# Patient Record
Sex: Male | Born: 1935 | Race: White | Hispanic: No | Marital: Married | Smoking: Former smoker
Health system: Southern US, Community
[De-identification: ages and names within clinical notes are randomized; demographics above are authoritative.]

## PROBLEM LIST (undated history)

## (undated) DIAGNOSIS — C801 Malignant (primary) neoplasm, unspecified: Secondary | ICD-10-CM

## (undated) DIAGNOSIS — I10 Essential (primary) hypertension: Secondary | ICD-10-CM

## (undated) DIAGNOSIS — I509 Heart failure, unspecified: Secondary | ICD-10-CM

## (undated) HISTORY — PX: ROTATOR CUFF REPAIR: SHX139

---

## 1972-09-29 HISTORY — PX: OTHER SURGICAL HISTORY: SHX169

## 1992-05-30 HISTORY — PX: OTHER SURGICAL HISTORY: SHX169

## 1994-09-29 DIAGNOSIS — C801 Malignant (primary) neoplasm, unspecified: Secondary | ICD-10-CM

## 1994-09-29 HISTORY — DX: Malignant (primary) neoplasm, unspecified: C80.1

## 1995-06-30 HISTORY — PX: HERNIA REPAIR: SHX51

## 1998-02-07 ENCOUNTER — Other Ambulatory Visit: Admission: RE | Admit: 1998-02-07 | Discharge: 1998-02-07 | Payer: Self-pay | Admitting: Internal Medicine

## 1998-02-22 ENCOUNTER — Other Ambulatory Visit: Admission: RE | Admit: 1998-02-22 | Discharge: 1998-02-22 | Payer: Self-pay | Admitting: Urology

## 1998-02-27 HISTORY — PX: OTHER SURGICAL HISTORY: SHX169

## 1998-03-09 ENCOUNTER — Inpatient Hospital Stay (HOSPITAL_COMMUNITY): Admission: RE | Admit: 1998-03-09 | Discharge: 1998-03-12 | Payer: Self-pay | Admitting: Urology

## 2001-09-29 HISTORY — PX: HERNIA REPAIR: SHX51

## 2001-10-19 ENCOUNTER — Encounter: Payer: Self-pay | Admitting: Surgery

## 2001-10-19 ENCOUNTER — Encounter: Admission: RE | Admit: 2001-10-19 | Discharge: 2001-10-19 | Payer: Self-pay | Admitting: Surgery

## 2001-10-20 ENCOUNTER — Ambulatory Visit (HOSPITAL_COMMUNITY): Admission: RE | Admit: 2001-10-20 | Discharge: 2001-10-20 | Payer: Self-pay | Admitting: Surgery

## 2004-08-29 HISTORY — PX: CARPAL TUNNEL RELEASE: SHX101

## 2004-09-09 ENCOUNTER — Encounter: Admission: RE | Admit: 2004-09-09 | Discharge: 2004-09-09 | Payer: Self-pay | Admitting: Orthopedic Surgery

## 2004-09-10 ENCOUNTER — Ambulatory Visit (HOSPITAL_COMMUNITY): Admission: RE | Admit: 2004-09-10 | Discharge: 2004-09-10 | Payer: Self-pay | Admitting: Orthopedic Surgery

## 2004-09-10 ENCOUNTER — Ambulatory Visit (HOSPITAL_BASED_OUTPATIENT_CLINIC_OR_DEPARTMENT_OTHER): Admission: RE | Admit: 2004-09-10 | Discharge: 2004-09-10 | Payer: Self-pay | Admitting: Orthopedic Surgery

## 2004-09-29 HISTORY — PX: CARPAL TUNNEL RELEASE: SHX101

## 2004-10-01 ENCOUNTER — Ambulatory Visit (HOSPITAL_BASED_OUTPATIENT_CLINIC_OR_DEPARTMENT_OTHER): Admission: RE | Admit: 2004-10-01 | Discharge: 2004-10-01 | Payer: Self-pay | Admitting: Orthopedic Surgery

## 2004-10-01 ENCOUNTER — Ambulatory Visit (HOSPITAL_COMMUNITY): Admission: RE | Admit: 2004-10-01 | Discharge: 2004-10-01 | Payer: Self-pay | Admitting: Orthopedic Surgery

## 2006-08-29 HISTORY — PX: OTHER SURGICAL HISTORY: SHX169

## 2007-08-18 ENCOUNTER — Encounter: Admission: RE | Admit: 2007-08-18 | Discharge: 2007-08-18 | Payer: Self-pay | Admitting: Orthopedic Surgery

## 2011-02-14 NOTE — Op Note (Signed)
[place]  Patient:    Corey Harrison, Corey Harrison Visit Number: [id number] MRN: [id number]          Service Type: DSU Location: [facility] 2899 30 Attending Physician:  Bonnetta Barry Dictated by:   Velora Heckler, M.D. Proc. Date: 10/20/01 Admit Date:  10/20/2001 Discharge Date: 10/20/2001   CC:         Pearla Dubonnet, M.D.   Operative Report  PREOPERATIVE DIAGNOSIS:  Reducible right inguinal hernia.  POSTOPERATIVE DIAGNOSIS:  Reducible right inguinal hernia.  OPERATION PERFORMED:  Repair right inguinal hernia with Prolene mesh.  SURGEON:  Velora Heckler, M.D.  ANESTHESIA:  General.  ESTIMATED BLOOD LOSS:  Minimal.  PREPARATION:  Betadine.  COMPLICATIONS:  None.  INDICATIONS FOR PROCEDURE:  The patient is a 77 year old white male who presents at the request of Dr. Robley Fries with right inguinal hernia first noted in November 2002.  The patient has noted mild discomfort in the right groin during his golf game and weight lifting regimen.  He has had no signs or symptoms of obstruction.  He has had no prior hernia surgery.  He now comes for this surgery for repair.  DESCRIPTION OF PROCEDURE:  The procedure was done in OR #17 at the [facility].  The patient was brought to the operating room and placed in supine position on the operating room table.  Following administration of general anesthesia, the patient was prepped and draped in the usual strict aseptic fashion.  After ascertaining that an adequate level of anesthesia had been obtained, a right inguinal incision was made with a #10 blade.  Dissection was carried down through the subcutaneous tissues to the external oblique fascia.  Hemostasis was obtained with the electrocautery. Gelpe retractor was placed for exposure.  External oblique fascia was incised in line with its fibers and extended through the external inguinal ring. Spermatic cord structures were  encircled with a Penrose drain.  The floor of the inguinal canal was defined.  Cord structures were dissected out.  An indirect inguinal hernia sac was identified and dissected away from the cord structures up to the level of the internal inguinal ring.  A high ligation of the sac was performed with a 2-0 silk suture ligature.  The sac was excised and discarded.  Next, the floor of the inguinal canal was recreated with a sheet of Prolene mesh.  The mesh was secured to the pubic tubercle and along the inginal ligament with a running 2-0 Novofil suture.  The superior mesh was split to accommodate the cord structures.  The superior margin of the mesh was secured with a transversalis and internal oblique fascia with interrupted 2-0 Novofil sutures.  Tails of the mesh were overlapped lateral to the cord structures and secured to the inguinal ligament with an interrupted 2-0 Novofil suture.  Field block was placed with local Marcaine anesthetic.  Good hemostasis was noted.  Cord structures were returned to the inguinal canal. External oblique fascia was closed with interrupted 3-0 Vicryl sutures. Subcutaneous tissues were closed withinterrupted 3-0 Vicryl sutures.  Skin edges were anesthetized with local anesthetic.  Skin edges were reapproximated with interrupted 4-0 Vicryl subcuticular sutures.  The wound was washed and dried and benzoin and Steri-Strips were applied.  Sterile gauze dressings were applied.  The patient was awakened from anesthesia and brought to the recovery room in stable condition well. Dictated by:   Velora Heckler, M.D. Attending Physician:  Bonnetta Barry  DD:  10/20/01 TD:  10/21/01 Job: [id number]

## 2011-02-14 NOTE — Op Note (Signed)
NAMEHIMMAT, Corey Harrison               ACCOUNT NO.:  [id number]   MEDICAL RECORD NO.:  [id number]          PATIENT TYPE:  AMB   LOCATION:  DSC                          FACILITY:  [facility]   PHYSICIAN:  Katy Fitch. Sypher Montez Hageman., M.D.DATE OF BIRTH:  [birth year]   DATE OF PROCEDURE:  10/01/2004  DATE OF DISCHARGE:                                 OPERATIVE REPORT   PREOPERATIVE DIAGNOSIS:  Chronic entrapment neuropathy, median nerve, left  carpal tunnel, with positive electrodiagnostic studies.   POSTOPERATIVE DIAGNOSIS:  Chronic entrapment neuropathy, median nerve, left  carpal tunnel, with positive electrodiagnostic studies.   OPERATION:  Release of left transverse carpal ligament.   SURGEON:  Katy Fitch. Sypher, M.D.   ASSISTANT:  Marveen Reeks. Dasnoit, P.A.-C.   ANESTHESIA:  General by LMA.   ANESTHESIOLOGIST:  Germaine Pomfret, M.D.   INDICATIONS FOR PROCEDURE:  The patient is a 77 year old man referred for  the evaluation and management of bilateral hand numbness by Dr. Candyce Churn.  A clinical examination suggested bilateral carpal tunnel  syndrome.  Electrodiagnostic studies confirmed bilateral median neuropathy.  Due to a failure to respond to non-operative measures, he is brought to the  operating room at this time for a release of his left transverse carpal  ligament.   DESCRIPTION OF PROCEDURE:  The patient is brought to the operating room and  placed in the supine position upon the operating room table.  Following  induction of general anesthesia by LMA, the left arm was prepped with  Betadine soap and solution and sterilely draped.  Following exsanguination  of the left arm with the Esmarch bandage, an arterial tourniquet on the  proximal brachium is inflated to 220 mmHg.  The procedure commenced with a  short incision in the line of the ring finger in the palm.  The subcutaneous  tissues are carefully divided, revealing the palmar fascia.  This was split  longitudinally to reveal the common sensory branch of the median nerve.  These were followed back to the transverse carpal ligament which was  carefully isolated from the median nerve.  The ligament was released along  its ulnar border, extending into the distal forearm.  This widely opened the  carpal canal.  No masses or other predicaments are noted.  Bleeding points along the margin  of the transverse carpal ligament are electrocauterized with bipolar  current, followed by a repair of the skin with intradermal #3-0 Prolene  suture.  A compressive dressing was applied with the volar plaster splint,  maintaining the wrist in five degrees of dorsiflexion.      Robe   RVS/MEDQ  D:  10/01/2004  T:  10/01/2004  Job:  [id number]   cc:   Candyce Churn, M.D.  [address] [place]  [place] [address]  Fax: [phone]

## 2011-02-14 NOTE — Op Note (Signed)
Corey Harrison, Corey Harrison               ACCOUNT NO.:  [id number]   MEDICAL RECORD NO.:  [id number]          PATIENT TYPE:  AMB   LOCATION:  DSC                          FACILITY:  [facility]   PHYSICIAN:  Katy Fitch. Sypher Montez Hageman., M.D.DATE OF BIRTH:  [birth year]   DATE OF PROCEDURE:  09/10/2004  DATE OF DISCHARGE:                                 OPERATIVE REPORT   PREOPERATIVE DIAGNOSES:  Entrapment neuropathy of median nerve, right carpal  tunnel.   POSTOPERATIVE DIAGNOSES:  Entrapment neuropathy of median nerve, right  carpal tunnel.   OPERATION PERFORMED:  Release of right transverse carpal ligament.   SURGEON:  Katy Fitch. Sypher, M.D.   ASSISTANT:  Jonni Sanger, P.A.   ANESTHESIA:  General by LMA.   SUPERVISING ANESTHESIOLOGIST:  Maren Beach, M.D.   INDICATIONS FOR PROCEDURE:  Wayman Cho is a 77 year old gentleman  referred for evaluation and management of a painful and numb right hand.  Clinical examination suggested carpal tunnel syndrome.  Electrodiagnostic  studies completed by Dr. Johna Roles confirmed significant carpal tunnel  syndrome.  After informed consent, the patient is brought to the operating  room at this time.   DESCRIPTION OF PROCEDURE:  Sukhdeep Biskup was brought to the operating room  and placed in supine position upon the operating table.  Following general  anesthesia by LMA, the right arm was prepped with Betadine soap and solution  and sterilely draped.  Following exsanguination of the right arm with an  Esmarch bandage, an arterial tourniquet was inflated to 220 mmHg.  The  procedure commenced with a short incision in line with the ring finger in  the palm.  Subcutaneous tissues are carefully divided revealing the palmar  fascia.  This was split longitudinally to reveal the common sensory branch  of the median nerve.  These were followed back to the transverse carpal  ligament, which was carefully isolated from the median nerve proper.  The  ligament  was released on its ulnar border extending to the distal forearm.  This widely opened the carpal canal.  No masses or other predicaments were  noted.  Bleeding points along the margin of the released ligament were  electrocauterized with bipolar current followed by repair of the skin with  intradermal 3-0 Prolene suture.  A compressive dressing was applied with a  volar plaster splint maintaining the wrist in five degrees dorsiflexion.   For aftercare Mr. Stiehl was given a prescription for Vicodin 5 mg one by  mouth every four to six hours as needed for pain, 20 tablets without refill.  The patient return to our office for follow-up in a week for dressing  change, suture removal and advancement to a therapy program.      Robe   RVS/MEDQ  D:  09/10/2004  T:  09/10/2004  Job:  [id number]

## 2011-10-07 DIAGNOSIS — D044 Carcinoma in situ of skin of scalp and neck: Secondary | ICD-10-CM | POA: Diagnosis not present

## 2011-10-07 DIAGNOSIS — C4442 Squamous cell carcinoma of skin of scalp and neck: Secondary | ICD-10-CM | POA: Diagnosis not present

## 2011-10-07 DIAGNOSIS — L821 Other seborrheic keratosis: Secondary | ICD-10-CM | POA: Diagnosis not present

## 2011-10-07 DIAGNOSIS — L570 Actinic keratosis: Secondary | ICD-10-CM | POA: Diagnosis not present

## 2011-12-10 DIAGNOSIS — M5137 Other intervertebral disc degeneration, lumbosacral region: Secondary | ICD-10-CM | POA: Diagnosis not present

## 2011-12-10 DIAGNOSIS — M999 Biomechanical lesion, unspecified: Secondary | ICD-10-CM | POA: Diagnosis not present

## 2011-12-10 DIAGNOSIS — S335XXA Sprain of ligaments of lumbar spine, initial encounter: Secondary | ICD-10-CM | POA: Diagnosis not present

## 2012-03-15 DIAGNOSIS — L570 Actinic keratosis: Secondary | ICD-10-CM | POA: Diagnosis not present

## 2012-03-15 DIAGNOSIS — D239 Other benign neoplasm of skin, unspecified: Secondary | ICD-10-CM | POA: Diagnosis not present

## 2012-03-15 DIAGNOSIS — L821 Other seborrheic keratosis: Secondary | ICD-10-CM | POA: Diagnosis not present

## 2012-03-16 DIAGNOSIS — M5137 Other intervertebral disc degeneration, lumbosacral region: Secondary | ICD-10-CM | POA: Diagnosis not present

## 2012-03-16 DIAGNOSIS — M999 Biomechanical lesion, unspecified: Secondary | ICD-10-CM | POA: Diagnosis not present

## 2012-03-16 DIAGNOSIS — S335XXA Sprain of ligaments of lumbar spine, initial encounter: Secondary | ICD-10-CM | POA: Diagnosis not present

## 2012-05-19 DIAGNOSIS — H25019 Cortical age-related cataract, unspecified eye: Secondary | ICD-10-CM | POA: Diagnosis not present

## 2012-05-19 DIAGNOSIS — H40019 Open angle with borderline findings, low risk, unspecified eye: Secondary | ICD-10-CM | POA: Diagnosis not present

## 2012-06-16 DIAGNOSIS — S335XXA Sprain of ligaments of lumbar spine, initial encounter: Secondary | ICD-10-CM | POA: Diagnosis not present

## 2012-06-16 DIAGNOSIS — M5137 Other intervertebral disc degeneration, lumbosacral region: Secondary | ICD-10-CM | POA: Diagnosis not present

## 2012-06-16 DIAGNOSIS — M999 Biomechanical lesion, unspecified: Secondary | ICD-10-CM | POA: Diagnosis not present

## 2012-06-23 DIAGNOSIS — Z8546 Personal history of malignant neoplasm of prostate: Secondary | ICD-10-CM | POA: Diagnosis not present

## 2012-06-23 DIAGNOSIS — Z79899 Other long term (current) drug therapy: Secondary | ICD-10-CM | POA: Diagnosis not present

## 2012-06-23 DIAGNOSIS — Z0000 Encounter for general adult medical examination without abnormal findings: Secondary | ICD-10-CM | POA: Diagnosis not present

## 2012-06-23 DIAGNOSIS — IMO0002 Reserved for concepts with insufficient information to code with codable children: Secondary | ICD-10-CM | POA: Diagnosis not present

## 2012-06-23 DIAGNOSIS — Z1331 Encounter for screening for depression: Secondary | ICD-10-CM | POA: Diagnosis not present

## 2012-06-23 DIAGNOSIS — E7800 Pure hypercholesterolemia, unspecified: Secondary | ICD-10-CM | POA: Diagnosis not present

## 2012-06-23 DIAGNOSIS — I10 Essential (primary) hypertension: Secondary | ICD-10-CM | POA: Diagnosis not present

## 2012-06-25 DIAGNOSIS — H35379 Puckering of macula, unspecified eye: Secondary | ICD-10-CM | POA: Diagnosis not present

## 2012-06-25 DIAGNOSIS — H25019 Cortical age-related cataract, unspecified eye: Secondary | ICD-10-CM | POA: Diagnosis not present

## 2012-06-29 HISTORY — PX: EYE SURGERY: SHX253

## 2012-06-30 DIAGNOSIS — M999 Biomechanical lesion, unspecified: Secondary | ICD-10-CM | POA: Diagnosis not present

## 2012-06-30 DIAGNOSIS — S335XXA Sprain of ligaments of lumbar spine, initial encounter: Secondary | ICD-10-CM | POA: Diagnosis not present

## 2012-06-30 DIAGNOSIS — M5137 Other intervertebral disc degeneration, lumbosacral region: Secondary | ICD-10-CM | POA: Diagnosis not present

## 2012-07-06 DIAGNOSIS — Z1211 Encounter for screening for malignant neoplasm of colon: Secondary | ICD-10-CM | POA: Diagnosis not present

## 2012-07-22 DIAGNOSIS — H2589 Other age-related cataract: Secondary | ICD-10-CM | POA: Diagnosis not present

## 2012-07-22 DIAGNOSIS — H25019 Cortical age-related cataract, unspecified eye: Secondary | ICD-10-CM | POA: Diagnosis not present

## 2012-09-07 DIAGNOSIS — IMO0002 Reserved for concepts with insufficient information to code with codable children: Secondary | ICD-10-CM | POA: Diagnosis not present

## 2012-09-14 DIAGNOSIS — L570 Actinic keratosis: Secondary | ICD-10-CM | POA: Diagnosis not present

## 2012-09-14 DIAGNOSIS — L259 Unspecified contact dermatitis, unspecified cause: Secondary | ICD-10-CM | POA: Diagnosis not present

## 2012-09-14 DIAGNOSIS — Z85828 Personal history of other malignant neoplasm of skin: Secondary | ICD-10-CM | POA: Diagnosis not present

## 2012-09-15 DIAGNOSIS — M5137 Other intervertebral disc degeneration, lumbosacral region: Secondary | ICD-10-CM | POA: Diagnosis not present

## 2012-09-15 DIAGNOSIS — M999 Biomechanical lesion, unspecified: Secondary | ICD-10-CM | POA: Diagnosis not present

## 2012-09-15 DIAGNOSIS — S335XXA Sprain of ligaments of lumbar spine, initial encounter: Secondary | ICD-10-CM | POA: Diagnosis not present

## 2012-10-22 DIAGNOSIS — I789 Disease of capillaries, unspecified: Secondary | ICD-10-CM | POA: Diagnosis not present

## 2012-10-22 DIAGNOSIS — L821 Other seborrheic keratosis: Secondary | ICD-10-CM | POA: Diagnosis not present

## 2012-10-22 DIAGNOSIS — Z85828 Personal history of other malignant neoplasm of skin: Secondary | ICD-10-CM | POA: Diagnosis not present

## 2012-10-22 DIAGNOSIS — L570 Actinic keratosis: Secondary | ICD-10-CM | POA: Diagnosis not present

## 2012-12-14 DIAGNOSIS — S335XXA Sprain of ligaments of lumbar spine, initial encounter: Secondary | ICD-10-CM | POA: Diagnosis not present

## 2012-12-14 DIAGNOSIS — M5137 Other intervertebral disc degeneration, lumbosacral region: Secondary | ICD-10-CM | POA: Diagnosis not present

## 2012-12-14 DIAGNOSIS — M999 Biomechanical lesion, unspecified: Secondary | ICD-10-CM | POA: Diagnosis not present

## 2013-03-15 DIAGNOSIS — L570 Actinic keratosis: Secondary | ICD-10-CM | POA: Diagnosis not present

## 2013-03-15 DIAGNOSIS — D1801 Hemangioma of skin and subcutaneous tissue: Secondary | ICD-10-CM | POA: Diagnosis not present

## 2013-03-15 DIAGNOSIS — L738 Other specified follicular disorders: Secondary | ICD-10-CM | POA: Diagnosis not present

## 2013-03-15 DIAGNOSIS — L821 Other seborrheic keratosis: Secondary | ICD-10-CM | POA: Diagnosis not present

## 2013-03-15 DIAGNOSIS — Z85828 Personal history of other malignant neoplasm of skin: Secondary | ICD-10-CM | POA: Diagnosis not present

## 2013-03-23 DIAGNOSIS — M999 Biomechanical lesion, unspecified: Secondary | ICD-10-CM | POA: Diagnosis not present

## 2013-03-23 DIAGNOSIS — S335XXA Sprain of ligaments of lumbar spine, initial encounter: Secondary | ICD-10-CM | POA: Diagnosis not present

## 2013-03-23 DIAGNOSIS — M5137 Other intervertebral disc degeneration, lumbosacral region: Secondary | ICD-10-CM | POA: Diagnosis not present

## 2013-05-18 DIAGNOSIS — Z961 Presence of intraocular lens: Secondary | ICD-10-CM | POA: Diagnosis not present

## 2013-05-18 DIAGNOSIS — H25019 Cortical age-related cataract, unspecified eye: Secondary | ICD-10-CM | POA: Diagnosis not present

## 2013-05-18 DIAGNOSIS — H35379 Puckering of macula, unspecified eye: Secondary | ICD-10-CM | POA: Diagnosis not present

## 2013-05-31 DIAGNOSIS — H3581 Retinal edema: Secondary | ICD-10-CM | POA: Diagnosis not present

## 2013-05-31 DIAGNOSIS — H35379 Puckering of macula, unspecified eye: Secondary | ICD-10-CM | POA: Diagnosis not present

## 2013-06-15 DIAGNOSIS — M999 Biomechanical lesion, unspecified: Secondary | ICD-10-CM | POA: Diagnosis not present

## 2013-06-15 DIAGNOSIS — M5137 Other intervertebral disc degeneration, lumbosacral region: Secondary | ICD-10-CM | POA: Diagnosis not present

## 2013-06-15 DIAGNOSIS — S335XXA Sprain of ligaments of lumbar spine, initial encounter: Secondary | ICD-10-CM | POA: Diagnosis not present

## 2013-07-07 DIAGNOSIS — M999 Biomechanical lesion, unspecified: Secondary | ICD-10-CM | POA: Diagnosis not present

## 2013-07-07 DIAGNOSIS — M5137 Other intervertebral disc degeneration, lumbosacral region: Secondary | ICD-10-CM | POA: Diagnosis not present

## 2013-07-07 DIAGNOSIS — S335XXA Sprain of ligaments of lumbar spine, initial encounter: Secondary | ICD-10-CM | POA: Diagnosis not present

## 2013-08-03 DIAGNOSIS — Z8546 Personal history of malignant neoplasm of prostate: Secondary | ICD-10-CM | POA: Diagnosis not present

## 2013-08-03 DIAGNOSIS — E538 Deficiency of other specified B group vitamins: Secondary | ICD-10-CM | POA: Diagnosis not present

## 2013-08-03 DIAGNOSIS — Z79899 Other long term (current) drug therapy: Secondary | ICD-10-CM | POA: Diagnosis not present

## 2013-08-03 DIAGNOSIS — E7800 Pure hypercholesterolemia, unspecified: Secondary | ICD-10-CM | POA: Diagnosis not present

## 2013-08-03 DIAGNOSIS — Z1331 Encounter for screening for depression: Secondary | ICD-10-CM | POA: Diagnosis not present

## 2013-08-03 DIAGNOSIS — I10 Essential (primary) hypertension: Secondary | ICD-10-CM | POA: Diagnosis not present

## 2013-08-03 DIAGNOSIS — Z0000 Encounter for general adult medical examination without abnormal findings: Secondary | ICD-10-CM | POA: Diagnosis not present

## 2013-08-03 DIAGNOSIS — Z23 Encounter for immunization: Secondary | ICD-10-CM | POA: Diagnosis not present

## 2013-08-08 DIAGNOSIS — Z1211 Encounter for screening for malignant neoplasm of colon: Secondary | ICD-10-CM | POA: Diagnosis not present

## 2013-09-01 DIAGNOSIS — M5137 Other intervertebral disc degeneration, lumbosacral region: Secondary | ICD-10-CM | POA: Diagnosis not present

## 2013-09-01 DIAGNOSIS — M999 Biomechanical lesion, unspecified: Secondary | ICD-10-CM | POA: Diagnosis not present

## 2013-09-01 DIAGNOSIS — S335XXA Sprain of ligaments of lumbar spine, initial encounter: Secondary | ICD-10-CM | POA: Diagnosis not present

## 2013-09-08 DIAGNOSIS — M5412 Radiculopathy, cervical region: Secondary | ICD-10-CM | POA: Diagnosis not present

## 2013-10-04 DIAGNOSIS — M5137 Other intervertebral disc degeneration, lumbosacral region: Secondary | ICD-10-CM | POA: Diagnosis not present

## 2013-10-04 DIAGNOSIS — S335XXA Sprain of ligaments of lumbar spine, initial encounter: Secondary | ICD-10-CM | POA: Diagnosis not present

## 2013-10-04 DIAGNOSIS — M999 Biomechanical lesion, unspecified: Secondary | ICD-10-CM | POA: Diagnosis not present

## 2013-10-07 DIAGNOSIS — L821 Other seborrheic keratosis: Secondary | ICD-10-CM | POA: Diagnosis not present

## 2013-10-07 DIAGNOSIS — L570 Actinic keratosis: Secondary | ICD-10-CM | POA: Diagnosis not present

## 2013-10-07 DIAGNOSIS — Z85828 Personal history of other malignant neoplasm of skin: Secondary | ICD-10-CM | POA: Diagnosis not present

## 2013-11-03 DIAGNOSIS — M5137 Other intervertebral disc degeneration, lumbosacral region: Secondary | ICD-10-CM | POA: Diagnosis not present

## 2013-11-03 DIAGNOSIS — S335XXA Sprain of ligaments of lumbar spine, initial encounter: Secondary | ICD-10-CM | POA: Diagnosis not present

## 2013-11-03 DIAGNOSIS — M9981 Other biomechanical lesions of cervical region: Secondary | ICD-10-CM | POA: Diagnosis not present

## 2013-11-03 DIAGNOSIS — M999 Biomechanical lesion, unspecified: Secondary | ICD-10-CM | POA: Diagnosis not present

## 2013-11-09 DIAGNOSIS — M999 Biomechanical lesion, unspecified: Secondary | ICD-10-CM | POA: Diagnosis not present

## 2013-11-09 DIAGNOSIS — M5137 Other intervertebral disc degeneration, lumbosacral region: Secondary | ICD-10-CM | POA: Diagnosis not present

## 2013-11-09 DIAGNOSIS — S335XXA Sprain of ligaments of lumbar spine, initial encounter: Secondary | ICD-10-CM | POA: Diagnosis not present

## 2013-11-09 DIAGNOSIS — R197 Diarrhea, unspecified: Secondary | ICD-10-CM | POA: Diagnosis not present

## 2013-11-09 DIAGNOSIS — M9981 Other biomechanical lesions of cervical region: Secondary | ICD-10-CM | POA: Diagnosis not present

## 2013-11-10 ENCOUNTER — Other Ambulatory Visit: Payer: Self-pay | Admitting: Gastroenterology

## 2013-11-17 DIAGNOSIS — M25559 Pain in unspecified hip: Secondary | ICD-10-CM | POA: Diagnosis not present

## 2013-11-17 DIAGNOSIS — M76899 Other specified enthesopathies of unspecified lower limb, excluding foot: Secondary | ICD-10-CM | POA: Diagnosis not present

## 2013-12-02 ENCOUNTER — Encounter (HOSPITAL_COMMUNITY): Payer: Self-pay | Admitting: Pharmacy Technician

## 2013-12-05 ENCOUNTER — Encounter (HOSPITAL_COMMUNITY): Payer: Self-pay | Admitting: *Deleted

## 2013-12-13 DIAGNOSIS — H43819 Vitreous degeneration, unspecified eye: Secondary | ICD-10-CM | POA: Diagnosis not present

## 2013-12-13 DIAGNOSIS — H35379 Puckering of macula, unspecified eye: Secondary | ICD-10-CM | POA: Diagnosis not present

## 2013-12-13 DIAGNOSIS — H2510 Age-related nuclear cataract, unspecified eye: Secondary | ICD-10-CM | POA: Diagnosis not present

## 2013-12-14 DIAGNOSIS — M9981 Other biomechanical lesions of cervical region: Secondary | ICD-10-CM | POA: Diagnosis not present

## 2013-12-14 DIAGNOSIS — M5137 Other intervertebral disc degeneration, lumbosacral region: Secondary | ICD-10-CM | POA: Diagnosis not present

## 2013-12-14 DIAGNOSIS — M999 Biomechanical lesion, unspecified: Secondary | ICD-10-CM | POA: Diagnosis not present

## 2013-12-14 DIAGNOSIS — S335XXA Sprain of ligaments of lumbar spine, initial encounter: Secondary | ICD-10-CM | POA: Diagnosis not present

## 2013-12-20 ENCOUNTER — Encounter (HOSPITAL_COMMUNITY)
Admission: RE | Disposition: A | Discharge status: Home / Self Care | Payer: Self-pay | Source: Ambulatory Visit | Attending: Gastroenterology

## 2013-12-20 ENCOUNTER — Ambulatory Visit (HOSPITAL_COMMUNITY): Payer: Medicare Other | Admitting: Anesthesiology

## 2013-12-20 ENCOUNTER — Encounter (HOSPITAL_COMMUNITY): Payer: Self-pay | Admitting: Certified Registered Nurse Anesthetist

## 2013-12-20 ENCOUNTER — Ambulatory Visit (HOSPITAL_COMMUNITY)
Admission: RE | Admit: 2013-12-20 | Discharge: 2013-12-20 | Disposition: A | Discharge status: Home / Self Care | Payer: Medicare Other | Source: Ambulatory Visit | Attending: Gastroenterology | Admitting: Gastroenterology

## 2013-12-20 ENCOUNTER — Encounter (HOSPITAL_COMMUNITY): Payer: Medicare Other | Admitting: Anesthesiology

## 2013-12-20 DIAGNOSIS — Z87891 Personal history of nicotine dependence: Secondary | ICD-10-CM | POA: Diagnosis not present

## 2013-12-20 DIAGNOSIS — E7800 Pure hypercholesterolemia, unspecified: Secondary | ICD-10-CM | POA: Diagnosis not present

## 2013-12-20 DIAGNOSIS — Z8546 Personal history of malignant neoplasm of prostate: Secondary | ICD-10-CM | POA: Insufficient documentation

## 2013-12-20 DIAGNOSIS — I10 Essential (primary) hypertension: Secondary | ICD-10-CM | POA: Diagnosis not present

## 2013-12-20 DIAGNOSIS — Z9079 Acquired absence of other genital organ(s): Secondary | ICD-10-CM | POA: Diagnosis not present

## 2013-12-20 DIAGNOSIS — Z1211 Encounter for screening for malignant neoplasm of colon: Secondary | ICD-10-CM | POA: Insufficient documentation

## 2013-12-20 HISTORY — DX: Malignant (primary) neoplasm, unspecified: C80.1

## 2013-12-20 HISTORY — PX: COLONOSCOPY WITH PROPOFOL: SHX5780

## 2013-12-20 HISTORY — DX: Essential (primary) hypertension: I10

## 2013-12-20 SURGERY — COLONOSCOPY WITH PROPOFOL
Anesthesia: Monitor Anesthesia Care

## 2013-12-20 MED ORDER — SODIUM CHLORIDE 0.9 % IV SOLN: INTRAVENOUS | Status: DC

## 2013-12-20 MED ORDER — FENTANYL CITRATE 0.05 MG/ML IJ SOLN
INTRAMUSCULAR | Status: AC
  Filled 2013-12-20: qty 2

## 2013-12-20 MED ORDER — LACTATED RINGERS IV SOLN
INTRAVENOUS | Status: DC | PRN
  Administered 2013-12-20: 13:00:00 via INTRAVENOUS

## 2013-12-20 MED ORDER — PROPOFOL 10 MG/ML IV BOLUS
INTRAVENOUS | Status: DC | PRN
  Administered 2013-12-20 (×2): 40 mg via INTRAVENOUS
  Administered 2013-12-20: 20 mg via INTRAVENOUS

## 2013-12-20 MED ORDER — FENTANYL CITRATE 0.05 MG/ML IJ SOLN
INTRAMUSCULAR | Status: DC | PRN
  Administered 2013-12-20 (×2): 50 ug via INTRAVENOUS

## 2013-12-20 MED ORDER — MIDAZOLAM HCL 5 MG/5ML IJ SOLN
INTRAMUSCULAR | Status: DC | PRN
  Administered 2013-12-20: 1 mg via INTRAVENOUS

## 2013-12-20 MED ORDER — MIDAZOLAM HCL 2 MG/2ML IJ SOLN
INTRAMUSCULAR | Status: AC
  Filled 2013-12-20: qty 2

## 2013-12-20 MED ORDER — PROPOFOL 10 MG/ML IV BOLUS
INTRAVENOUS | Status: AC
Start: 2013-12-20 — End: 2013-12-20
  Filled 2013-12-20: qty 20

## 2013-12-20 MED ORDER — PROMETHAZINE HCL 25 MG/ML IJ SOLN: 6.2500 mg | INTRAMUSCULAR | Status: DC | PRN

## 2013-12-20 SURGICAL SUPPLY — 22 items

## 2013-12-20 NOTE — H&P (Signed)
  Procedure: Screening colonoscopy  History: The patient is an 80 year old male born 1929/04/23. He underwent a normal screening colonoscopy on 04/10/2004. He is scheduled to undergo a repeat screening colonoscopy today.  Past medical history: Hypertension. Hypercholesterolemia. Prostate cancer. Allergic rhinitis. Sinus surgery with removal of nasal polyps. Incarcerated necrotic small and large bowel requiring surgery in 1996. Left inguinal hernia repair. Left carpal Thomas surgery. Anal fissure repair. Prostatectomy.  Medication allergies: None  Exam: The patient is alert and lying comfortably on the endoscopy stretcher. Abdomen is soft and nontender to palpation. Lungs are clear to auscultation. Cardiac exam reveals a regular rhythm.  Plan: Proceed with screening colonoscopy

## 2013-12-20 NOTE — Preoperative (Signed)
Beta Blockers   Reason not to administer Beta Blockers:Not Applicable 

## 2013-12-20 NOTE — Anesthesia Preprocedure Evaluation (Signed)
Anesthesia Evaluation  Patient identified by MRN, date of birth, ID band Patient awake    Reviewed: Allergy & Precautions, H&P , NPO status , Patient's Chart, lab work & pertinent test results  Airway Mallampati: II TM Distance: >3 FB Neck ROM: Full    Dental no notable dental hx.    Pulmonary neg pulmonary ROS, former smoker,  breath sounds clear to auscultation  Pulmonary exam normal       Cardiovascular hypertension, Pt. on medications Rhythm:Regular Rate:Normal     Neuro/Psych negative neurological ROS  negative psych ROS   GI/Hepatic negative GI ROS, Neg liver ROS,   Endo/Other  negative endocrine ROS  Renal/GU negative Renal ROS  negative genitourinary   Musculoskeletal negative musculoskeletal ROS (+)   Abdominal   Peds negative pediatric ROS (+)  Hematology negative hematology ROS (+)   Anesthesia Other Findings   Reproductive/Obstetrics negative OB ROS                           Anesthesia Physical Anesthesia Plan  ASA: II  Anesthesia Plan: MAC   Post-op Pain Management:    Induction: Intravenous  Airway Management Planned: Nasal Cannula  Additional Equipment:   Intra-op Plan:   Post-operative Plan:   Informed Consent: I have reviewed the patients History and Physical, chart, labs and discussed the procedure including the risks, benefits and alternatives for the proposed anesthesia with the patient or authorized representative who has indicated his/her understanding and acceptance.   Dental advisory given  Plan Discussed with: CRNA and Surgeon  Anesthesia Plan Comments:         Anesthesia Quick Evaluation  

## 2013-12-20 NOTE — Op Note (Signed)
Procedure: Repeat screening colonoscopy. Normal screening colonoscopy performed on 04/10/2004  Endoscopist: Earle Gell  Premedication: Propofol administered by anesthesia  Procedure: The patient was placed in the left lateral decubitus position. Anal inspection and digital rectal exam were normal. The Pentax pediatric colonoscope was introduced into the rectum and advanced to the cecum. A normal-appearing ileocecal valve and appendiceal orifice were identified. Colonic preparation for the exam today was good.  Rectum. Normal. Retroflexed view of the distal rectum normal  Sigmoid colon and descending colon. Normal  Splenic flexure. Normal  Transverse colon. Normal  Hepatic flexure. Normal  Ascending colon. Normal  Cecum and ileocecal valve. Normal  Assessment: Normal screening proctocolonoscopy to the cecum

## 2013-12-20 NOTE — Transfer of Care (Signed)
Immediate Anesthesia Transfer of Care Note  Patient: Corey Harrison  Procedure(s) Performed: Procedure(s): COLONOSCOPY WITH PROPOFOL (N/A)  Patient Location: PACU and Endoscopy Unit  Anesthesia Type:MAC  Level of Consciousness: awake, alert , oriented and patient cooperative  Airway & Oxygen Therapy: Patient Spontanous Breathing and Patient connected to face mask oxygen  Post-op Assessment: Report given to PACU RN, Post -op Vital signs reviewed and stable and Patient moving all extremities  Post vital signs: Reviewed and stable  Complications: No apparent anesthesia complications

## 2013-12-20 NOTE — Discharge Instructions (Signed)
Colonoscopy, Care After °Refer to this sheet in the next few weeks. These instructions provide you with information on caring for yourself after your procedure. Your health care provider may also give you more specific instructions. Your treatment has been planned according to current medical practices, but problems sometimes occur. Call your health care provider if you have any problems or questions after your procedure. °WHAT TO EXPECT AFTER THE PROCEDURE  °After your procedure, it is typical to have the following: °· A small amount of blood in your stool. °· Moderate amounts of gas and mild abdominal cramping or bloating. °HOME CARE INSTRUCTIONS °· Do not drive, operate machinery, or sign important documents for 24 hours. °· You may shower and resume your regular physical activities, but move at a slower pace for the first 24 hours. °· Take frequent rest periods for the first 24 hours. °· Walk around or put a warm pack on your abdomen to help reduce abdominal cramping and bloating. °· Drink enough fluids to keep your urine clear or pale yellow. °· You may resume your normal diet as instructed by your health care provider. Avoid heavy or fried foods that are hard to digest. °· Avoid drinking alcohol for 24 hours or as instructed by your health care provider. °· Only take over-the-counter or prescription medicines as directed by your health care provider. °· If a tissue sample (biopsy) was taken during your procedure: °· Do not take aspirin or blood thinners for 7 days, or as instructed by your health care provider. °· Do not drink alcohol for 7 days, or as instructed by your health care provider. °· Eat soft foods for the first 24 hours. °SEEK MEDICAL CARE IF: °You have persistent spotting of blood in your stool 2 3 days after the procedure. °SEEK IMMEDIATE MEDICAL CARE IF: °· You have more than a small spotting of blood in your stool. °· You pass large blood clots in your stool. °· Your abdomen is swollen  (distended). °· You have nausea or vomiting. °· You have a fever. °· You have increasing abdominal pain that is not relieved with medicine. °Document Released: 04/29/2004 Document Revised: 07/06/2013 Document Reviewed: 05/23/2013 °ExitCare® Patient Information ©2014 [employer]. ° °

## 2013-12-21 ENCOUNTER — Encounter (HOSPITAL_COMMUNITY): Payer: Self-pay | Admitting: Gastroenterology

## 2013-12-21 NOTE — Anesthesia Postprocedure Evaluation (Signed)
  Anesthesia Post-op Note  Patient: Corey Harrison  Procedure(s) Performed: Procedure(s) (LRB): COLONOSCOPY WITH PROPOFOL (N/A)  Patient Location: PACU  Anesthesia Type: MAC  Level of Consciousness: awake and alert   Airway and Oxygen Therapy: Patient Spontanous Breathing  Post-op Pain: mild  Post-op Assessment: Post-op Vital signs reviewed, Patient's Cardiovascular Status Stable, Respiratory Function Stable, Patent Airway and No signs of Nausea or vomiting  Last Vitals:  Filed Vitals:   12/20/13 1350  BP: 162/73  Pulse:   Temp:   Resp: 16    Post-op Vital Signs: stable   Complications: No apparent anesthesia complications

## 2014-03-14 DIAGNOSIS — S335XXA Sprain of ligaments of lumbar spine, initial encounter: Secondary | ICD-10-CM | POA: Diagnosis not present

## 2014-03-14 DIAGNOSIS — M9981 Other biomechanical lesions of cervical region: Secondary | ICD-10-CM | POA: Diagnosis not present

## 2014-03-14 DIAGNOSIS — M5137 Other intervertebral disc degeneration, lumbosacral region: Secondary | ICD-10-CM | POA: Diagnosis not present

## 2014-03-14 DIAGNOSIS — M999 Biomechanical lesion, unspecified: Secondary | ICD-10-CM | POA: Diagnosis not present

## 2014-03-16 DIAGNOSIS — M25559 Pain in unspecified hip: Secondary | ICD-10-CM | POA: Diagnosis not present

## 2014-04-05 DIAGNOSIS — M999 Biomechanical lesion, unspecified: Secondary | ICD-10-CM | POA: Diagnosis not present

## 2014-04-05 DIAGNOSIS — S335XXA Sprain of ligaments of lumbar spine, initial encounter: Secondary | ICD-10-CM | POA: Diagnosis not present

## 2014-04-05 DIAGNOSIS — M5137 Other intervertebral disc degeneration, lumbosacral region: Secondary | ICD-10-CM | POA: Diagnosis not present

## 2014-04-18 DIAGNOSIS — L821 Other seborrheic keratosis: Secondary | ICD-10-CM | POA: Diagnosis not present

## 2014-04-18 DIAGNOSIS — D1801 Hemangioma of skin and subcutaneous tissue: Secondary | ICD-10-CM | POA: Diagnosis not present

## 2014-04-18 DIAGNOSIS — Z85828 Personal history of other malignant neoplasm of skin: Secondary | ICD-10-CM | POA: Diagnosis not present

## 2014-04-18 DIAGNOSIS — L570 Actinic keratosis: Secondary | ICD-10-CM | POA: Diagnosis not present

## 2014-05-31 DIAGNOSIS — Z961 Presence of intraocular lens: Secondary | ICD-10-CM | POA: Diagnosis not present

## 2014-05-31 DIAGNOSIS — H52209 Unspecified astigmatism, unspecified eye: Secondary | ICD-10-CM | POA: Diagnosis not present

## 2014-05-31 DIAGNOSIS — H259 Unspecified age-related cataract: Secondary | ICD-10-CM | POA: Diagnosis not present

## 2014-05-31 DIAGNOSIS — H35379 Puckering of macula, unspecified eye: Secondary | ICD-10-CM | POA: Diagnosis not present

## 2014-06-06 DIAGNOSIS — M5137 Other intervertebral disc degeneration, lumbosacral region: Secondary | ICD-10-CM | POA: Diagnosis not present

## 2014-06-06 DIAGNOSIS — M999 Biomechanical lesion, unspecified: Secondary | ICD-10-CM | POA: Diagnosis not present

## 2014-06-06 DIAGNOSIS — S335XXA Sprain of ligaments of lumbar spine, initial encounter: Secondary | ICD-10-CM | POA: Diagnosis not present

## 2014-08-09 DIAGNOSIS — Z0000 Encounter for general adult medical examination without abnormal findings: Secondary | ICD-10-CM | POA: Diagnosis not present

## 2014-08-09 DIAGNOSIS — I499 Cardiac arrhythmia, unspecified: Secondary | ICD-10-CM | POA: Diagnosis not present

## 2014-08-09 DIAGNOSIS — Z79899 Other long term (current) drug therapy: Secondary | ICD-10-CM | POA: Diagnosis not present

## 2014-08-09 DIAGNOSIS — E782 Mixed hyperlipidemia: Secondary | ICD-10-CM | POA: Diagnosis not present

## 2014-08-09 DIAGNOSIS — J309 Allergic rhinitis, unspecified: Secondary | ICD-10-CM | POA: Diagnosis not present

## 2014-08-09 DIAGNOSIS — Z1389 Encounter for screening for other disorder: Secondary | ICD-10-CM | POA: Diagnosis not present

## 2014-08-09 DIAGNOSIS — Z125 Encounter for screening for malignant neoplasm of prostate: Secondary | ICD-10-CM | POA: Diagnosis not present

## 2014-08-09 DIAGNOSIS — Z23 Encounter for immunization: Secondary | ICD-10-CM | POA: Diagnosis not present

## 2014-08-09 DIAGNOSIS — Z8546 Personal history of malignant neoplasm of prostate: Secondary | ICD-10-CM | POA: Diagnosis not present

## 2014-08-09 DIAGNOSIS — I10 Essential (primary) hypertension: Secondary | ICD-10-CM | POA: Diagnosis not present

## 2014-09-05 DIAGNOSIS — S39012A Strain of muscle, fascia and tendon of lower back, initial encounter: Secondary | ICD-10-CM | POA: Diagnosis not present

## 2014-09-05 DIAGNOSIS — M9902 Segmental and somatic dysfunction of thoracic region: Secondary | ICD-10-CM | POA: Diagnosis not present

## 2014-09-05 DIAGNOSIS — M9905 Segmental and somatic dysfunction of pelvic region: Secondary | ICD-10-CM | POA: Diagnosis not present

## 2014-09-05 DIAGNOSIS — M5136 Other intervertebral disc degeneration, lumbar region: Secondary | ICD-10-CM | POA: Diagnosis not present

## 2014-09-05 DIAGNOSIS — S29012A Strain of muscle and tendon of back wall of thorax, initial encounter: Secondary | ICD-10-CM | POA: Diagnosis not present

## 2014-09-05 DIAGNOSIS — M9903 Segmental and somatic dysfunction of lumbar region: Secondary | ICD-10-CM | POA: Diagnosis not present

## 2014-09-12 DIAGNOSIS — M25552 Pain in left hip: Secondary | ICD-10-CM | POA: Diagnosis not present

## 2014-10-09 DIAGNOSIS — H2512 Age-related nuclear cataract, left eye: Secondary | ICD-10-CM | POA: Diagnosis not present

## 2014-10-17 DIAGNOSIS — M25562 Pain in left knee: Secondary | ICD-10-CM | POA: Diagnosis not present

## 2014-10-23 DIAGNOSIS — M79662 Pain in left lower leg: Secondary | ICD-10-CM | POA: Diagnosis not present

## 2014-10-23 DIAGNOSIS — M6281 Muscle weakness (generalized): Secondary | ICD-10-CM | POA: Diagnosis not present

## 2014-10-23 DIAGNOSIS — S86112D Strain of other muscle(s) and tendon(s) of posterior muscle group at lower leg level, left leg, subsequent encounter: Secondary | ICD-10-CM | POA: Diagnosis not present

## 2014-10-24 DIAGNOSIS — Z85828 Personal history of other malignant neoplasm of skin: Secondary | ICD-10-CM | POA: Diagnosis not present

## 2014-10-24 DIAGNOSIS — L570 Actinic keratosis: Secondary | ICD-10-CM | POA: Diagnosis not present

## 2014-10-26 DIAGNOSIS — S86112D Strain of other muscle(s) and tendon(s) of posterior muscle group at lower leg level, left leg, subsequent encounter: Secondary | ICD-10-CM | POA: Diagnosis not present

## 2014-10-26 DIAGNOSIS — M79662 Pain in left lower leg: Secondary | ICD-10-CM | POA: Diagnosis not present

## 2014-10-26 DIAGNOSIS — M6281 Muscle weakness (generalized): Secondary | ICD-10-CM | POA: Diagnosis not present

## 2014-10-30 DIAGNOSIS — M79662 Pain in left lower leg: Secondary | ICD-10-CM | POA: Diagnosis not present

## 2014-10-30 DIAGNOSIS — M6281 Muscle weakness (generalized): Secondary | ICD-10-CM | POA: Diagnosis not present

## 2014-10-30 DIAGNOSIS — S86112D Strain of other muscle(s) and tendon(s) of posterior muscle group at lower leg level, left leg, subsequent encounter: Secondary | ICD-10-CM | POA: Diagnosis not present

## 2014-11-01 DIAGNOSIS — S86112D Strain of other muscle(s) and tendon(s) of posterior muscle group at lower leg level, left leg, subsequent encounter: Secondary | ICD-10-CM | POA: Diagnosis not present

## 2014-11-01 DIAGNOSIS — M79662 Pain in left lower leg: Secondary | ICD-10-CM | POA: Diagnosis not present

## 2014-11-01 DIAGNOSIS — M6281 Muscle weakness (generalized): Secondary | ICD-10-CM | POA: Diagnosis not present

## 2014-11-03 DIAGNOSIS — J209 Acute bronchitis, unspecified: Secondary | ICD-10-CM | POA: Diagnosis not present

## 2014-11-06 DIAGNOSIS — M6281 Muscle weakness (generalized): Secondary | ICD-10-CM | POA: Diagnosis not present

## 2014-11-06 DIAGNOSIS — M79662 Pain in left lower leg: Secondary | ICD-10-CM | POA: Diagnosis not present

## 2014-11-06 DIAGNOSIS — S86112D Strain of other muscle(s) and tendon(s) of posterior muscle group at lower leg level, left leg, subsequent encounter: Secondary | ICD-10-CM | POA: Diagnosis not present

## 2014-11-07 DIAGNOSIS — S86112D Strain of other muscle(s) and tendon(s) of posterior muscle group at lower leg level, left leg, subsequent encounter: Secondary | ICD-10-CM | POA: Diagnosis not present

## 2014-11-16 DIAGNOSIS — H25012 Cortical age-related cataract, left eye: Secondary | ICD-10-CM | POA: Diagnosis not present

## 2014-11-16 DIAGNOSIS — H25812 Combined forms of age-related cataract, left eye: Secondary | ICD-10-CM | POA: Diagnosis not present

## 2014-11-16 DIAGNOSIS — H2512 Age-related nuclear cataract, left eye: Secondary | ICD-10-CM | POA: Diagnosis not present

## 2014-11-27 ENCOUNTER — Other Ambulatory Visit: Payer: Self-pay | Admitting: Nurse Practitioner

## 2014-11-27 ENCOUNTER — Ambulatory Visit
Admission: RE | Admit: 2014-11-27 | Discharge: 2014-11-27 | Disposition: A | Discharge status: Home / Self Care | Payer: Medicare Other | Source: Ambulatory Visit | Attending: Nurse Practitioner | Admitting: Nurse Practitioner

## 2014-11-27 DIAGNOSIS — J209 Acute bronchitis, unspecified: Secondary | ICD-10-CM | POA: Diagnosis not present

## 2014-11-27 DIAGNOSIS — J40 Bronchitis, not specified as acute or chronic: Secondary | ICD-10-CM

## 2014-11-27 DIAGNOSIS — R0602 Shortness of breath: Secondary | ICD-10-CM | POA: Diagnosis not present

## 2014-11-27 DIAGNOSIS — R05 Cough: Secondary | ICD-10-CM | POA: Diagnosis not present

## 2014-12-05 DIAGNOSIS — S39012A Strain of muscle, fascia and tendon of lower back, initial encounter: Secondary | ICD-10-CM | POA: Diagnosis not present

## 2014-12-05 DIAGNOSIS — S29012A Strain of muscle and tendon of back wall of thorax, initial encounter: Secondary | ICD-10-CM | POA: Diagnosis not present

## 2014-12-05 DIAGNOSIS — M5136 Other intervertebral disc degeneration, lumbar region: Secondary | ICD-10-CM | POA: Diagnosis not present

## 2014-12-05 DIAGNOSIS — M9903 Segmental and somatic dysfunction of lumbar region: Secondary | ICD-10-CM | POA: Diagnosis not present

## 2014-12-05 DIAGNOSIS — M9902 Segmental and somatic dysfunction of thoracic region: Secondary | ICD-10-CM | POA: Diagnosis not present

## 2014-12-05 DIAGNOSIS — M9905 Segmental and somatic dysfunction of pelvic region: Secondary | ICD-10-CM | POA: Diagnosis not present

## 2014-12-26 ENCOUNTER — Other Ambulatory Visit: Payer: Self-pay | Admitting: Nurse Practitioner

## 2014-12-26 ENCOUNTER — Ambulatory Visit
Admission: RE | Admit: 2014-12-26 | Discharge: 2014-12-26 | Disposition: A | Discharge status: Home / Self Care | Payer: Medicare Other | Source: Ambulatory Visit | Attending: Nurse Practitioner | Admitting: Nurse Practitioner

## 2014-12-26 DIAGNOSIS — J069 Acute upper respiratory infection, unspecified: Secondary | ICD-10-CM

## 2014-12-26 DIAGNOSIS — J984 Other disorders of lung: Secondary | ICD-10-CM | POA: Diagnosis not present

## 2014-12-26 DIAGNOSIS — Z87891 Personal history of nicotine dependence: Secondary | ICD-10-CM | POA: Diagnosis not present

## 2014-12-28 ENCOUNTER — Other Ambulatory Visit: Payer: Self-pay | Admitting: Internal Medicine

## 2014-12-28 DIAGNOSIS — R938 Abnormal findings on diagnostic imaging of other specified body structures: Secondary | ICD-10-CM | POA: Diagnosis not present

## 2014-12-28 DIAGNOSIS — J984 Other disorders of lung: Secondary | ICD-10-CM

## 2014-12-28 DIAGNOSIS — R9389 Abnormal findings on diagnostic imaging of other specified body structures: Secondary | ICD-10-CM

## 2015-01-02 ENCOUNTER — Ambulatory Visit
Admission: RE | Admit: 2015-01-02 | Discharge: 2015-01-02 | Disposition: A | Discharge status: Home / Self Care | Payer: Medicare Other | Source: Ambulatory Visit | Attending: Internal Medicine | Admitting: Internal Medicine

## 2015-01-02 DIAGNOSIS — R9389 Abnormal findings on diagnostic imaging of other specified body structures: Secondary | ICD-10-CM

## 2015-01-02 DIAGNOSIS — J984 Other disorders of lung: Secondary | ICD-10-CM

## 2015-01-02 DIAGNOSIS — R918 Other nonspecific abnormal finding of lung field: Secondary | ICD-10-CM | POA: Diagnosis not present

## 2015-01-02 MED ORDER — IOPAMIDOL (ISOVUE-300) INJECTION 61%
75.0000 mL | Freq: Once | INTRAVENOUS | Status: AC | PRN
  Administered 2015-01-02: 75 mL via INTRAVENOUS

## 2015-01-23 DIAGNOSIS — S86112D Strain of other muscle(s) and tendon(s) of posterior muscle group at lower leg level, left leg, subsequent encounter: Secondary | ICD-10-CM | POA: Diagnosis not present

## 2015-02-27 DIAGNOSIS — M5136 Other intervertebral disc degeneration, lumbar region: Secondary | ICD-10-CM | POA: Diagnosis not present

## 2015-02-27 DIAGNOSIS — M9905 Segmental and somatic dysfunction of pelvic region: Secondary | ICD-10-CM | POA: Diagnosis not present

## 2015-02-27 DIAGNOSIS — S39012A Strain of muscle, fascia and tendon of lower back, initial encounter: Secondary | ICD-10-CM | POA: Diagnosis not present

## 2015-02-27 DIAGNOSIS — M9903 Segmental and somatic dysfunction of lumbar region: Secondary | ICD-10-CM | POA: Diagnosis not present

## 2015-02-27 DIAGNOSIS — M9902 Segmental and somatic dysfunction of thoracic region: Secondary | ICD-10-CM | POA: Diagnosis not present

## 2015-02-27 DIAGNOSIS — S29012A Strain of muscle and tendon of back wall of thorax, initial encounter: Secondary | ICD-10-CM | POA: Diagnosis not present

## 2015-03-29 ENCOUNTER — Other Ambulatory Visit: Payer: Self-pay | Admitting: Internal Medicine

## 2015-03-29 ENCOUNTER — Ambulatory Visit
Admission: RE | Admit: 2015-03-29 | Discharge: 2015-03-29 | Disposition: A | Discharge status: Home / Self Care | Payer: Medicare Other | Source: Ambulatory Visit | Attending: Internal Medicine | Admitting: Internal Medicine

## 2015-03-29 DIAGNOSIS — R938 Abnormal findings on diagnostic imaging of other specified body structures: Secondary | ICD-10-CM | POA: Diagnosis not present

## 2015-03-29 DIAGNOSIS — J984 Other disorders of lung: Secondary | ICD-10-CM | POA: Diagnosis not present

## 2015-03-29 DIAGNOSIS — I10 Essential (primary) hypertension: Secondary | ICD-10-CM | POA: Diagnosis not present

## 2015-03-29 DIAGNOSIS — J929 Pleural plaque without asbestos: Secondary | ICD-10-CM | POA: Diagnosis not present

## 2015-03-29 DIAGNOSIS — R9389 Abnormal findings on diagnostic imaging of other specified body structures: Secondary | ICD-10-CM

## 2015-05-01 DIAGNOSIS — L720 Epidermal cyst: Secondary | ICD-10-CM | POA: Diagnosis not present

## 2015-05-01 DIAGNOSIS — L820 Inflamed seborrheic keratosis: Secondary | ICD-10-CM | POA: Diagnosis not present

## 2015-05-01 DIAGNOSIS — Z85828 Personal history of other malignant neoplasm of skin: Secondary | ICD-10-CM | POA: Diagnosis not present

## 2015-05-01 DIAGNOSIS — L812 Freckles: Secondary | ICD-10-CM | POA: Diagnosis not present

## 2015-05-01 DIAGNOSIS — L570 Actinic keratosis: Secondary | ICD-10-CM | POA: Diagnosis not present

## 2015-05-01 DIAGNOSIS — L821 Other seborrheic keratosis: Secondary | ICD-10-CM | POA: Diagnosis not present

## 2015-05-01 DIAGNOSIS — B351 Tinea unguium: Secondary | ICD-10-CM | POA: Diagnosis not present

## 2015-06-12 DIAGNOSIS — S39012A Strain of muscle, fascia and tendon of lower back, initial encounter: Secondary | ICD-10-CM | POA: Diagnosis not present

## 2015-06-12 DIAGNOSIS — M9905 Segmental and somatic dysfunction of pelvic region: Secondary | ICD-10-CM | POA: Diagnosis not present

## 2015-06-12 DIAGNOSIS — M5136 Other intervertebral disc degeneration, lumbar region: Secondary | ICD-10-CM | POA: Diagnosis not present

## 2015-06-12 DIAGNOSIS — S29012A Strain of muscle and tendon of back wall of thorax, initial encounter: Secondary | ICD-10-CM | POA: Diagnosis not present

## 2015-06-12 DIAGNOSIS — M9903 Segmental and somatic dysfunction of lumbar region: Secondary | ICD-10-CM | POA: Diagnosis not present

## 2015-06-12 DIAGNOSIS — M9902 Segmental and somatic dysfunction of thoracic region: Secondary | ICD-10-CM | POA: Diagnosis not present

## 2015-06-19 DIAGNOSIS — Z961 Presence of intraocular lens: Secondary | ICD-10-CM | POA: Diagnosis not present

## 2015-06-19 DIAGNOSIS — H26493 Other secondary cataract, bilateral: Secondary | ICD-10-CM | POA: Diagnosis not present

## 2015-06-19 DIAGNOSIS — H35371 Puckering of macula, right eye: Secondary | ICD-10-CM | POA: Diagnosis not present

## 2015-06-19 DIAGNOSIS — H3531 Nonexudative age-related macular degeneration: Secondary | ICD-10-CM | POA: Diagnosis not present

## 2015-08-09 DIAGNOSIS — S86112D Strain of other muscle(s) and tendon(s) of posterior muscle group at lower leg level, left leg, subsequent encounter: Secondary | ICD-10-CM | POA: Diagnosis not present

## 2015-08-16 DIAGNOSIS — S86112D Strain of other muscle(s) and tendon(s) of posterior muscle group at lower leg level, left leg, subsequent encounter: Secondary | ICD-10-CM | POA: Diagnosis not present

## 2015-09-04 DIAGNOSIS — M5136 Other intervertebral disc degeneration, lumbar region: Secondary | ICD-10-CM | POA: Diagnosis not present

## 2015-09-04 DIAGNOSIS — S29012A Strain of muscle and tendon of back wall of thorax, initial encounter: Secondary | ICD-10-CM | POA: Diagnosis not present

## 2015-09-04 DIAGNOSIS — M9902 Segmental and somatic dysfunction of thoracic region: Secondary | ICD-10-CM | POA: Diagnosis not present

## 2015-09-04 DIAGNOSIS — M9903 Segmental and somatic dysfunction of lumbar region: Secondary | ICD-10-CM | POA: Diagnosis not present

## 2015-09-04 DIAGNOSIS — M9905 Segmental and somatic dysfunction of pelvic region: Secondary | ICD-10-CM | POA: Diagnosis not present

## 2015-09-04 DIAGNOSIS — S39012A Strain of muscle, fascia and tendon of lower back, initial encounter: Secondary | ICD-10-CM | POA: Diagnosis not present

## 2015-09-11 DIAGNOSIS — I499 Cardiac arrhythmia, unspecified: Secondary | ICD-10-CM | POA: Diagnosis not present

## 2015-09-11 DIAGNOSIS — E782 Mixed hyperlipidemia: Secondary | ICD-10-CM | POA: Diagnosis not present

## 2015-09-11 DIAGNOSIS — D81818 Other biotin-dependent carboxylase deficiency: Secondary | ICD-10-CM | POA: Diagnosis not present

## 2015-09-11 DIAGNOSIS — Z23 Encounter for immunization: Secondary | ICD-10-CM | POA: Diagnosis not present

## 2015-09-11 DIAGNOSIS — Z0001 Encounter for general adult medical examination with abnormal findings: Secondary | ICD-10-CM | POA: Diagnosis not present

## 2015-09-11 DIAGNOSIS — J309 Allergic rhinitis, unspecified: Secondary | ICD-10-CM | POA: Diagnosis not present

## 2015-09-11 DIAGNOSIS — Z79899 Other long term (current) drug therapy: Secondary | ICD-10-CM | POA: Diagnosis not present

## 2015-09-11 DIAGNOSIS — B351 Tinea unguium: Secondary | ICD-10-CM | POA: Diagnosis not present

## 2015-09-11 DIAGNOSIS — Z1389 Encounter for screening for other disorder: Secondary | ICD-10-CM | POA: Diagnosis not present

## 2015-09-11 DIAGNOSIS — R938 Abnormal findings on diagnostic imaging of other specified body structures: Secondary | ICD-10-CM | POA: Diagnosis not present

## 2015-09-11 DIAGNOSIS — I10 Essential (primary) hypertension: Secondary | ICD-10-CM | POA: Diagnosis not present

## 2015-09-11 DIAGNOSIS — C61 Malignant neoplasm of prostate: Secondary | ICD-10-CM | POA: Diagnosis not present

## 2015-09-12 ENCOUNTER — Other Ambulatory Visit: Payer: Self-pay | Admitting: Orthopedic Surgery

## 2015-09-12 DIAGNOSIS — M25512 Pain in left shoulder: Secondary | ICD-10-CM

## 2015-09-21 ENCOUNTER — Ambulatory Visit
Admission: RE | Admit: 2015-09-21 | Discharge: 2015-09-21 | Disposition: A | Discharge status: Home / Self Care | Payer: Medicare Other | Source: Ambulatory Visit | Attending: Orthopedic Surgery | Admitting: Orthopedic Surgery

## 2015-09-21 DIAGNOSIS — S46012A Strain of muscle(s) and tendon(s) of the rotator cuff of left shoulder, initial encounter: Secondary | ICD-10-CM | POA: Diagnosis not present

## 2015-09-21 DIAGNOSIS — M25512 Pain in left shoulder: Secondary | ICD-10-CM

## 2015-09-28 ENCOUNTER — Other Ambulatory Visit: Payer: Self-pay

## 2015-09-28 ENCOUNTER — Ambulatory Visit (HOSPITAL_COMMUNITY): Payer: Medicare Other | Attending: Cardiology

## 2015-09-28 ENCOUNTER — Other Ambulatory Visit (HOSPITAL_COMMUNITY): Payer: Self-pay | Admitting: Internal Medicine

## 2015-09-28 DIAGNOSIS — I517 Cardiomegaly: Secondary | ICD-10-CM | POA: Diagnosis not present

## 2015-09-28 DIAGNOSIS — I071 Rheumatic tricuspid insufficiency: Secondary | ICD-10-CM | POA: Diagnosis not present

## 2015-09-28 DIAGNOSIS — I10 Essential (primary) hypertension: Secondary | ICD-10-CM | POA: Diagnosis not present

## 2015-09-28 DIAGNOSIS — I340 Nonrheumatic mitral (valve) insufficiency: Secondary | ICD-10-CM | POA: Insufficient documentation

## 2015-09-28 DIAGNOSIS — I351 Nonrheumatic aortic (valve) insufficiency: Secondary | ICD-10-CM | POA: Insufficient documentation

## 2015-09-28 DIAGNOSIS — R9431 Abnormal electrocardiogram [ECG] [EKG]: Secondary | ICD-10-CM | POA: Diagnosis not present

## 2015-09-28 DIAGNOSIS — I371 Nonrheumatic pulmonary valve insufficiency: Secondary | ICD-10-CM | POA: Insufficient documentation

## 2015-09-28 DIAGNOSIS — E785 Hyperlipidemia, unspecified: Secondary | ICD-10-CM | POA: Diagnosis not present

## 2015-10-17 DIAGNOSIS — S46202A Unspecified injury of muscle, fascia and tendon of other parts of biceps, left arm, initial encounter: Secondary | ICD-10-CM | POA: Diagnosis not present

## 2015-10-17 DIAGNOSIS — Y929 Unspecified place or not applicable: Secondary | ICD-10-CM | POA: Diagnosis not present

## 2015-10-17 DIAGNOSIS — G8918 Other acute postprocedural pain: Secondary | ICD-10-CM | POA: Diagnosis not present

## 2015-10-17 DIAGNOSIS — M24112 Other articular cartilage disorders, left shoulder: Secondary | ICD-10-CM | POA: Diagnosis not present

## 2015-10-17 DIAGNOSIS — M75112 Incomplete rotator cuff tear or rupture of left shoulder, not specified as traumatic: Secondary | ICD-10-CM | POA: Diagnosis not present

## 2015-10-17 DIAGNOSIS — M7552 Bursitis of left shoulder: Secondary | ICD-10-CM | POA: Diagnosis not present

## 2015-10-17 DIAGNOSIS — S46012A Strain of muscle(s) and tendon(s) of the rotator cuff of left shoulder, initial encounter: Secondary | ICD-10-CM | POA: Diagnosis not present

## 2015-10-17 DIAGNOSIS — Y999 Unspecified external cause status: Secondary | ICD-10-CM | POA: Diagnosis not present

## 2015-10-17 DIAGNOSIS — M94212 Chondromalacia, left shoulder: Secondary | ICD-10-CM | POA: Diagnosis not present

## 2015-10-19 DIAGNOSIS — S46012D Strain of muscle(s) and tendon(s) of the rotator cuff of left shoulder, subsequent encounter: Secondary | ICD-10-CM | POA: Diagnosis not present

## 2015-10-19 DIAGNOSIS — S46109D Unspecified injury of muscle, fascia and tendon of long head of biceps, unspecified arm, subsequent encounter: Secondary | ICD-10-CM | POA: Diagnosis not present

## 2015-10-19 DIAGNOSIS — M6281 Muscle weakness (generalized): Secondary | ICD-10-CM | POA: Diagnosis not present

## 2015-10-24 DIAGNOSIS — M6281 Muscle weakness (generalized): Secondary | ICD-10-CM | POA: Diagnosis not present

## 2015-10-24 DIAGNOSIS — S46109D Unspecified injury of muscle, fascia and tendon of long head of biceps, unspecified arm, subsequent encounter: Secondary | ICD-10-CM | POA: Diagnosis not present

## 2015-10-24 DIAGNOSIS — S46012D Strain of muscle(s) and tendon(s) of the rotator cuff of left shoulder, subsequent encounter: Secondary | ICD-10-CM | POA: Diagnosis not present

## 2015-10-25 DIAGNOSIS — S46012D Strain of muscle(s) and tendon(s) of the rotator cuff of left shoulder, subsequent encounter: Secondary | ICD-10-CM | POA: Diagnosis not present

## 2015-10-26 DIAGNOSIS — S46012D Strain of muscle(s) and tendon(s) of the rotator cuff of left shoulder, subsequent encounter: Secondary | ICD-10-CM | POA: Diagnosis not present

## 2015-10-26 DIAGNOSIS — S46109D Unspecified injury of muscle, fascia and tendon of long head of biceps, unspecified arm, subsequent encounter: Secondary | ICD-10-CM | POA: Diagnosis not present

## 2015-10-26 DIAGNOSIS — M6281 Muscle weakness (generalized): Secondary | ICD-10-CM | POA: Diagnosis not present

## 2015-10-31 DIAGNOSIS — M6281 Muscle weakness (generalized): Secondary | ICD-10-CM | POA: Diagnosis not present

## 2015-10-31 DIAGNOSIS — S46012D Strain of muscle(s) and tendon(s) of the rotator cuff of left shoulder, subsequent encounter: Secondary | ICD-10-CM | POA: Diagnosis not present

## 2015-10-31 DIAGNOSIS — S46109D Unspecified injury of muscle, fascia and tendon of long head of biceps, unspecified arm, subsequent encounter: Secondary | ICD-10-CM | POA: Diagnosis not present

## 2015-11-02 DIAGNOSIS — S46012D Strain of muscle(s) and tendon(s) of the rotator cuff of left shoulder, subsequent encounter: Secondary | ICD-10-CM | POA: Diagnosis not present

## 2015-11-02 DIAGNOSIS — M6281 Muscle weakness (generalized): Secondary | ICD-10-CM | POA: Diagnosis not present

## 2015-11-02 DIAGNOSIS — S46109D Unspecified injury of muscle, fascia and tendon of long head of biceps, unspecified arm, subsequent encounter: Secondary | ICD-10-CM | POA: Diagnosis not present

## 2015-11-07 DIAGNOSIS — M6281 Muscle weakness (generalized): Secondary | ICD-10-CM | POA: Diagnosis not present

## 2015-11-07 DIAGNOSIS — S46012D Strain of muscle(s) and tendon(s) of the rotator cuff of left shoulder, subsequent encounter: Secondary | ICD-10-CM | POA: Diagnosis not present

## 2015-11-07 DIAGNOSIS — S46109D Unspecified injury of muscle, fascia and tendon of long head of biceps, unspecified arm, subsequent encounter: Secondary | ICD-10-CM | POA: Diagnosis not present

## 2015-11-09 DIAGNOSIS — M6281 Muscle weakness (generalized): Secondary | ICD-10-CM | POA: Diagnosis not present

## 2015-11-09 DIAGNOSIS — S46012D Strain of muscle(s) and tendon(s) of the rotator cuff of left shoulder, subsequent encounter: Secondary | ICD-10-CM | POA: Diagnosis not present

## 2015-11-09 DIAGNOSIS — S46109D Unspecified injury of muscle, fascia and tendon of long head of biceps, unspecified arm, subsequent encounter: Secondary | ICD-10-CM | POA: Diagnosis not present

## 2015-11-12 DIAGNOSIS — S39012A Strain of muscle, fascia and tendon of lower back, initial encounter: Secondary | ICD-10-CM | POA: Diagnosis not present

## 2015-11-12 DIAGNOSIS — M5136 Other intervertebral disc degeneration, lumbar region: Secondary | ICD-10-CM | POA: Diagnosis not present

## 2015-11-12 DIAGNOSIS — M9905 Segmental and somatic dysfunction of pelvic region: Secondary | ICD-10-CM | POA: Diagnosis not present

## 2015-11-12 DIAGNOSIS — M9903 Segmental and somatic dysfunction of lumbar region: Secondary | ICD-10-CM | POA: Diagnosis not present

## 2015-11-12 DIAGNOSIS — S29012A Strain of muscle and tendon of back wall of thorax, initial encounter: Secondary | ICD-10-CM | POA: Diagnosis not present

## 2015-11-12 DIAGNOSIS — M9902 Segmental and somatic dysfunction of thoracic region: Secondary | ICD-10-CM | POA: Diagnosis not present

## 2015-11-13 DIAGNOSIS — Z85828 Personal history of other malignant neoplasm of skin: Secondary | ICD-10-CM | POA: Diagnosis not present

## 2015-11-13 DIAGNOSIS — D1801 Hemangioma of skin and subcutaneous tissue: Secondary | ICD-10-CM | POA: Diagnosis not present

## 2015-11-13 DIAGNOSIS — I788 Other diseases of capillaries: Secondary | ICD-10-CM | POA: Diagnosis not present

## 2015-11-13 DIAGNOSIS — L570 Actinic keratosis: Secondary | ICD-10-CM | POA: Diagnosis not present

## 2015-11-13 DIAGNOSIS — L821 Other seborrheic keratosis: Secondary | ICD-10-CM | POA: Diagnosis not present

## 2015-11-16 DIAGNOSIS — S46012D Strain of muscle(s) and tendon(s) of the rotator cuff of left shoulder, subsequent encounter: Secondary | ICD-10-CM | POA: Diagnosis not present

## 2015-11-16 DIAGNOSIS — M6281 Muscle weakness (generalized): Secondary | ICD-10-CM | POA: Diagnosis not present

## 2015-11-16 DIAGNOSIS — S46109D Unspecified injury of muscle, fascia and tendon of long head of biceps, unspecified arm, subsequent encounter: Secondary | ICD-10-CM | POA: Diagnosis not present

## 2015-11-20 DIAGNOSIS — M6281 Muscle weakness (generalized): Secondary | ICD-10-CM | POA: Diagnosis not present

## 2015-11-20 DIAGNOSIS — S46012D Strain of muscle(s) and tendon(s) of the rotator cuff of left shoulder, subsequent encounter: Secondary | ICD-10-CM | POA: Diagnosis not present

## 2015-11-20 DIAGNOSIS — S46109D Unspecified injury of muscle, fascia and tendon of long head of biceps, unspecified arm, subsequent encounter: Secondary | ICD-10-CM | POA: Diagnosis not present

## 2015-11-22 DIAGNOSIS — S46012D Strain of muscle(s) and tendon(s) of the rotator cuff of left shoulder, subsequent encounter: Secondary | ICD-10-CM | POA: Diagnosis not present

## 2015-12-18 DIAGNOSIS — Z961 Presence of intraocular lens: Secondary | ICD-10-CM | POA: Diagnosis not present

## 2015-12-18 DIAGNOSIS — H52203 Unspecified astigmatism, bilateral: Secondary | ICD-10-CM | POA: Diagnosis not present

## 2015-12-18 DIAGNOSIS — H26493 Other secondary cataract, bilateral: Secondary | ICD-10-CM | POA: Diagnosis not present

## 2015-12-18 DIAGNOSIS — H35371 Puckering of macula, right eye: Secondary | ICD-10-CM | POA: Diagnosis not present

## 2016-01-01 DIAGNOSIS — M9902 Segmental and somatic dysfunction of thoracic region: Secondary | ICD-10-CM | POA: Diagnosis not present

## 2016-01-01 DIAGNOSIS — M9903 Segmental and somatic dysfunction of lumbar region: Secondary | ICD-10-CM | POA: Diagnosis not present

## 2016-01-01 DIAGNOSIS — M5136 Other intervertebral disc degeneration, lumbar region: Secondary | ICD-10-CM | POA: Diagnosis not present

## 2016-01-01 DIAGNOSIS — M9905 Segmental and somatic dysfunction of pelvic region: Secondary | ICD-10-CM | POA: Diagnosis not present

## 2016-01-01 DIAGNOSIS — S39012A Strain of muscle, fascia and tendon of lower back, initial encounter: Secondary | ICD-10-CM | POA: Diagnosis not present

## 2016-01-01 DIAGNOSIS — S29012A Strain of muscle and tendon of back wall of thorax, initial encounter: Secondary | ICD-10-CM | POA: Diagnosis not present

## 2016-01-15 DIAGNOSIS — J9801 Acute bronchospasm: Secondary | ICD-10-CM | POA: Diagnosis not present

## 2016-01-24 DIAGNOSIS — M5416 Radiculopathy, lumbar region: Secondary | ICD-10-CM | POA: Diagnosis not present

## 2016-01-24 DIAGNOSIS — M76891 Other specified enthesopathies of right lower limb, excluding foot: Secondary | ICD-10-CM | POA: Diagnosis not present

## 2016-02-05 DIAGNOSIS — M5416 Radiculopathy, lumbar region: Secondary | ICD-10-CM | POA: Diagnosis not present

## 2016-04-08 DIAGNOSIS — S29012A Strain of muscle and tendon of back wall of thorax, initial encounter: Secondary | ICD-10-CM | POA: Diagnosis not present

## 2016-04-08 DIAGNOSIS — M9902 Segmental and somatic dysfunction of thoracic region: Secondary | ICD-10-CM | POA: Diagnosis not present

## 2016-04-08 DIAGNOSIS — S39012A Strain of muscle, fascia and tendon of lower back, initial encounter: Secondary | ICD-10-CM | POA: Diagnosis not present

## 2016-04-08 DIAGNOSIS — M9903 Segmental and somatic dysfunction of lumbar region: Secondary | ICD-10-CM | POA: Diagnosis not present

## 2016-04-08 DIAGNOSIS — M9905 Segmental and somatic dysfunction of pelvic region: Secondary | ICD-10-CM | POA: Diagnosis not present

## 2016-04-08 DIAGNOSIS — M5136 Other intervertebral disc degeneration, lumbar region: Secondary | ICD-10-CM | POA: Diagnosis not present

## 2016-05-13 DIAGNOSIS — L905 Scar conditions and fibrosis of skin: Secondary | ICD-10-CM | POA: Diagnosis not present

## 2016-05-13 DIAGNOSIS — D1801 Hemangioma of skin and subcutaneous tissue: Secondary | ICD-10-CM | POA: Diagnosis not present

## 2016-05-13 DIAGNOSIS — L570 Actinic keratosis: Secondary | ICD-10-CM | POA: Diagnosis not present

## 2016-05-13 DIAGNOSIS — Z85828 Personal history of other malignant neoplasm of skin: Secondary | ICD-10-CM | POA: Diagnosis not present

## 2016-05-13 DIAGNOSIS — C44319 Basal cell carcinoma of skin of other parts of face: Secondary | ICD-10-CM | POA: Diagnosis not present

## 2016-05-13 DIAGNOSIS — D485 Neoplasm of uncertain behavior of skin: Secondary | ICD-10-CM | POA: Diagnosis not present

## 2016-05-13 DIAGNOSIS — L821 Other seborrheic keratosis: Secondary | ICD-10-CM | POA: Diagnosis not present

## 2016-05-15 DIAGNOSIS — H6121 Impacted cerumen, right ear: Secondary | ICD-10-CM | POA: Diagnosis not present

## 2016-05-16 ENCOUNTER — Encounter (HOSPITAL_COMMUNITY): Payer: Self-pay

## 2016-05-16 ENCOUNTER — Emergency Department (HOSPITAL_COMMUNITY)
Admission: EM | Admit: 2016-05-16 | Discharge: 2016-05-16 | Disposition: A | Discharge status: Home / Self Care | Payer: Medicare Other | Attending: Emergency Medicine | Admitting: Emergency Medicine

## 2016-05-16 DIAGNOSIS — I10 Essential (primary) hypertension: Secondary | ICD-10-CM | POA: Diagnosis not present

## 2016-05-16 DIAGNOSIS — Z8546 Personal history of malignant neoplasm of prostate: Secondary | ICD-10-CM | POA: Insufficient documentation

## 2016-05-16 DIAGNOSIS — R42 Dizziness and giddiness: Secondary | ICD-10-CM

## 2016-05-16 DIAGNOSIS — Z87891 Personal history of nicotine dependence: Secondary | ICD-10-CM | POA: Insufficient documentation

## 2016-05-16 DIAGNOSIS — Z79899 Other long term (current) drug therapy: Secondary | ICD-10-CM | POA: Diagnosis not present

## 2016-05-16 DIAGNOSIS — E869 Volume depletion, unspecified: Secondary | ICD-10-CM | POA: Insufficient documentation

## 2016-05-16 DIAGNOSIS — Z7982 Long term (current) use of aspirin: Secondary | ICD-10-CM | POA: Diagnosis not present

## 2016-05-16 LAB — CBC
HCT: 43.8 % (ref 39.0–52.0)
Hemoglobin: 14.6 g/dL (ref 13.0–17.0)
MCH: 32.8 pg (ref 26.0–34.0)
MCHC: 33.3 g/dL (ref 30.0–36.0)
MCV: 98.4 fL (ref 78.0–100.0)
Platelets: 244 10*3/uL (ref 150–400)
RBC: 4.45 MIL/uL (ref 4.22–5.81)
RDW: 14.4 % (ref 11.5–15.5)
WBC: 9.8 10*3/uL (ref 4.0–10.5)

## 2016-05-16 LAB — URINALYSIS, ROUTINE W REFLEX MICROSCOPIC
Bilirubin Urine: NEGATIVE
Glucose, UA: NEGATIVE mg/dL
Hgb urine dipstick: NEGATIVE
Ketones, ur: NEGATIVE mg/dL
Leukocytes, UA: NEGATIVE
Nitrite: NEGATIVE
Protein, ur: NEGATIVE mg/dL
Specific Gravity, Urine: 1.024 (ref 1.005–1.030)
pH: 7.5 (ref 5.0–8.0)

## 2016-05-16 LAB — BASIC METABOLIC PANEL
Anion gap: 8 (ref 5–15)
BUN: 17 mg/dL (ref 6–20)
CO2: 26 mmol/L (ref 22–32)
Calcium: 9.8 mg/dL (ref 8.9–10.3)
Chloride: 105 mmol/L (ref 101–111)
Creatinine, Ser: 1 mg/dL (ref 0.61–1.24)
GFR calc Af Amer: >60 mL/min (ref >60)
GFR calc non Af Amer: >60 mL/min (ref >60)
Glucose, Bld: 115 mg/dL — ABNORMAL HIGH (ref 65–99)
Potassium: 3.9 mmol/L (ref 3.5–5.1)
Sodium: 139 mmol/L (ref 135–145)

## 2016-05-16 MED ORDER — SODIUM CHLORIDE 0.9 % IV BOLUS (SEPSIS)
1000.0000 mL | Freq: Once | INTRAVENOUS | Status: AC
  Administered 2016-05-16: 1000 mL via INTRAVENOUS

## 2016-05-16 NOTE — ED Notes (Signed)
RN starting IV and obtaining labs

## 2016-05-16 NOTE — ED Triage Notes (Signed)
Pt c/o dizziness starting this morning.  Denies pain.  Sts similar episode previously, but sts "it relieved quickly before."  Sts he was has been checking his BP and HR frequently and "it's been all over the place."  Hx of HTN.

## 2016-05-16 NOTE — ED Provider Notes (Signed)
[facility] DEPT Provider Note   CSN: [id number] Arrival date & time: 05/16/16  1122     History   Chief Complaint Chief Complaint  Patient presents with  . Dizziness    HPI Corey Harrison is a 82 y.o. male.  The history is provided by the patient and medical records.  Patient reports he developed a sensation of dizziness which began this morning.  Initially this morning approximately 3 AM he described as a sensation of room spinning.  Later this morning he described it more as a sensation of lightheadedness with standing.  His been eating and drinking normally.  He does report that he exercises daily and his continued exercise and the high heat index of the past several mornings.  Denies melena or hematochezia.  Reports no fevers or chills.  Denies chest pain or chest tightness.  No headache.  Denies weakness of his arms or legs.  Family reports no confusion or mental status changes.  Past Medical History:  Diagnosis Date  . Cancer [facility]) 1996   prostate  . Hypertension     There are no active problems to display for this patient.   Past Surgical History:  Procedure Laterality Date  . CARPAL TUNNEL RELEASE Right dec 2005  . CARPAL TUNNEL RELEASE Left jan 2006  . COLONOSCOPY WITH PROPOFOL N/A 12/20/2013   Procedure: COLONOSCOPY WITH PROPOFOL;  Surgeon: Garlan Fair, MD;  Location: [facility] ENDOSCOPY;  Service: Endoscopy;  Laterality: N/A;  . endoscopic sinus surgery  sept 1993  . EYE SURGERY  06-2012   right eye cataract  . HERNIA REPAIR  oct 1996   diaphragmatic  . HERNIA REPAIR Right jan 2003  . left hernia repair  dec 2007  . prostate surgery for cancer  02-1998  . rectal fistula repair  1974  . ROTATOR CUFF REPAIR Left        Home Medications    Prior to Admission medications   Medication Sig Start Date End Date Taking? Authorizing Provider  aspirin EC 81 MG tablet Take 81 mg by mouth every other day.   Yes Historical Provider, MD    diphenhydramine-acetaminophen (TYLENOL PM) 25-500 MG TABS Take 1 tablet by mouth at bedtime as needed (pain/sleep).   Yes Historical Provider, MD  Glucosamine 500 MG CAPS Take 1 capsule by mouth daily.   Yes Historical Provider, MD  Multiple Vitamin (MULTIVITAMIN WITH MINERALS) TABS tablet Take 1 tablet by mouth daily.   Yes Historical Provider, MD  Multiple Vitamins-Minerals (PRESERVISION AREDS PO) Take 1 tablet by mouth daily.   Yes Historical Provider, MD  simvastatin (ZOCOR) 20 MG tablet Take 20 mg by mouth every morning.   Yes Historical Provider, MD  valsartan (DIOVAN) 80 MG tablet Take 80 mg by mouth every morning.   Yes Historical Provider, MD    Family History History reviewed. No pertinent family history.  Social History Social History  Substance Use Topics  . Smoking status: Former Smoker    Packs/day: 1.00    Years: 45.00    Types: Cigarettes    Quit date: 06/30/1995  . Smokeless tobacco: Never Used  . Alcohol use Yes     Comment: daily 3 oz alcohol     Allergies   Review of patient's allergies indicates no known allergies.   Review of Systems Review of Systems  All other systems reviewed and are negative.    Physical Exam Updated Vital Signs BP 176/83 (BP Location: Left Arm)   Pulse 78  Temp 97.3 F (36.3 C) (Oral)   Resp 16   Ht 5\' 9"  (1.753 m)   Wt 164 lb (74.4 kg)   SpO2 96%   BMI 24.22 kg/m   Physical Exam  Constitutional: He is oriented to person, place, and time. He appears well-developed and well-nourished.  HENT:  Head: Normocephalic and atraumatic.  Eyes: EOM are normal.  Neck: Normal range of motion.  Cardiovascular: Normal rate, regular rhythm, normal heart sounds and intact distal pulses.   Pulmonary/Chest: Effort normal and breath sounds normal. No respiratory distress.  Abdominal: Soft. He exhibits no distension. There is no tenderness.  Musculoskeletal: Normal range of motion.  Neurological: He is alert and oriented to person,  place, and time.  Skin: Skin is warm and dry.  Psychiatric: He has a normal mood and affect. Judgment normal.  Nursing note and vitals reviewed.    ED Treatments / Results  Labs (all labs ordered are listed, but only abnormal results are displayed) Labs Reviewed  BASIC METABOLIC PANEL - Abnormal; Notable for the following:       Result Value   Glucose, Bld 115 (*)    All other components within normal limits  CBC  URINALYSIS, ROUTINE W REFLEX MICROSCOPIC (NOT AT [facility])    EKG  EKG Interpretation  Date/Time:  Friday May 16 2016 12:05:51 EDT Ventricular Rate:  69 PR Interval:    QRS Duration: 94 QT Interval:  473 QTC Calculation: 507 R Axis:   42 Text Interpretation:  Sinus arrhythmia Borderline prolonged PR interval Anterior infarct, old Minimal ST depression, lateral leads Prolonged QT interval No significant change was found Confirmed by Kao Conry  MD, Avie Checo ([id number]) on 05/16/2016 1:23:18 PM       Radiology No results found.  Procedures Procedures (including critical care time)  Medications Ordered in ED Medications  sodium chloride 0.9 % bolus 1,000 mL (0 mLs Intravenous Stopped 05/16/16 1551)     Initial Impression / Assessment and Plan / ED Course  I have reviewed the triage vital signs and the nursing notes.  Pertinent labs & imaging results that were available during my care of the patient were reviewed by me and considered in my medical decision making (see chart for details).  Clinical Course     Patient is overall well-appearing.  He did have some mild sinus arrhythmia with some bradycardia.  He always had P waves.  I do not think he needs additional workup at this time.  He will deftly need to see cardiology as an outpatient.  I do not think he needs a pacemaker at this time.  We ambulated around the emergency department without difficulty.  He had no lightheadedness or weakness.  No chest pain or shortness breath.    Final Clinical Impressions(s) / ED  Diagnoses   Final diagnoses:  Dizziness  Volume depletion    New Prescriptions New Prescriptions   No medications on file     Jola Schmidt, MD 05/16/16 [phone]

## 2016-05-19 DIAGNOSIS — M26621 Arthralgia of right temporomandibular joint: Secondary | ICD-10-CM | POA: Diagnosis not present

## 2016-05-30 DIAGNOSIS — M26621 Arthralgia of right temporomandibular joint: Secondary | ICD-10-CM | POA: Diagnosis not present

## 2016-05-30 DIAGNOSIS — Z23 Encounter for immunization: Secondary | ICD-10-CM | POA: Diagnosis not present

## 2016-06-16 DIAGNOSIS — M26609 Unspecified temporomandibular joint disorder, unspecified side: Secondary | ICD-10-CM | POA: Diagnosis not present

## 2016-08-05 DIAGNOSIS — M5136 Other intervertebral disc degeneration, lumbar region: Secondary | ICD-10-CM | POA: Diagnosis not present

## 2016-08-05 DIAGNOSIS — S29012A Strain of muscle and tendon of back wall of thorax, initial encounter: Secondary | ICD-10-CM | POA: Diagnosis not present

## 2016-08-05 DIAGNOSIS — M9905 Segmental and somatic dysfunction of pelvic region: Secondary | ICD-10-CM | POA: Diagnosis not present

## 2016-08-05 DIAGNOSIS — M9902 Segmental and somatic dysfunction of thoracic region: Secondary | ICD-10-CM | POA: Diagnosis not present

## 2016-08-05 DIAGNOSIS — M9903 Segmental and somatic dysfunction of lumbar region: Secondary | ICD-10-CM | POA: Diagnosis not present

## 2016-08-05 DIAGNOSIS — S39012A Strain of muscle, fascia and tendon of lower back, initial encounter: Secondary | ICD-10-CM | POA: Diagnosis not present

## 2016-10-10 IMAGING — MR MR SHOULDER*L* W/O CM
5 series · 39 of 40 positions shown · non-contrast
Comparison: MRI left shoulder 08/17/2008.

CLINICAL DATA: Chronic left shoulder pain with acute worsening over
the past 2 months. Initial encounter.

EXAM:
MRI OF THE LEFT SHOULDER WITHOUT CONTRAST
TECHNIQUE: Multiplanar, multisequence MR imaging of the shoulder was performed.
No intravenous contrast was administered.

[Series 6: T2 fat-sat · axial · left · 3.0mm · 0.44mm/px · z∈[-45,+60]mm · 8 of 29 slices shown (1 of 3)]
[im 1/29]
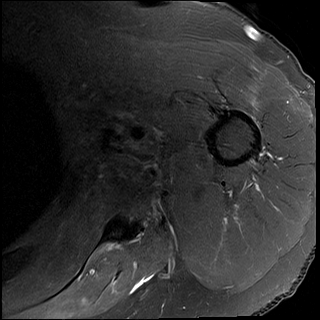
[im 5/29]
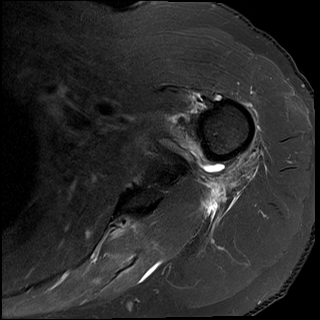
[im 9/29]
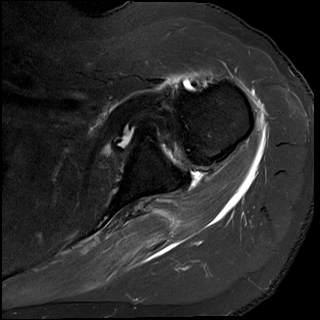
[im 13/29]
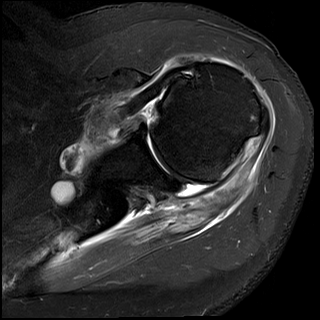
[im 17/29]
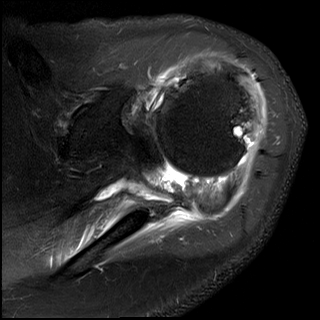
[im 21/29]
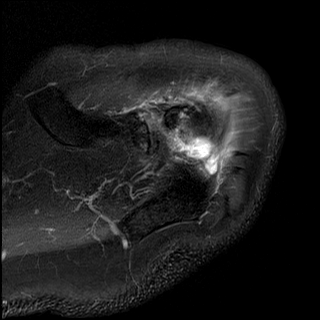
[im 25/29]
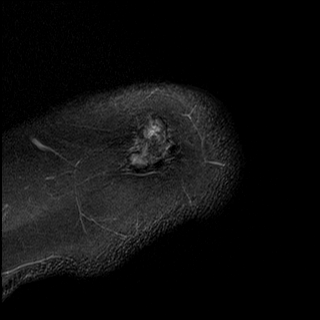
[im 29/29]
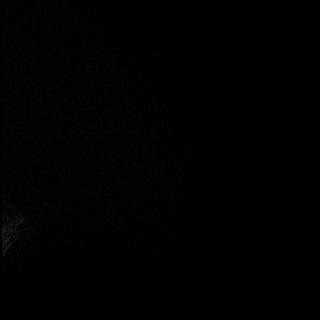

[Series 7: T2 fat-sat · oblique · left · 3.0mm · 0.44mm/px · 8 of 25 slices shown (2 of 3)]
[im 1/25]
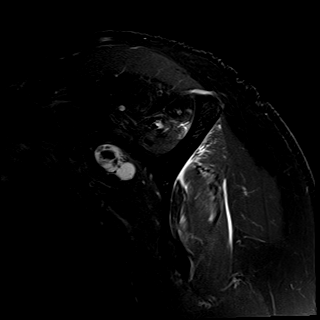
[im 4/25]
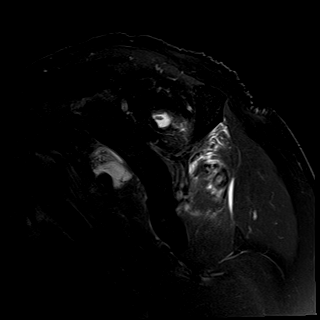
[im 7/25]
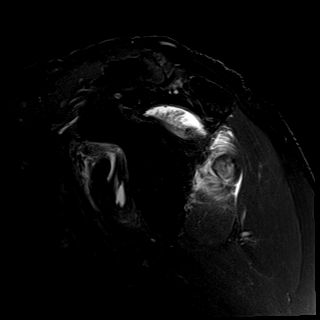
[im 11/25]
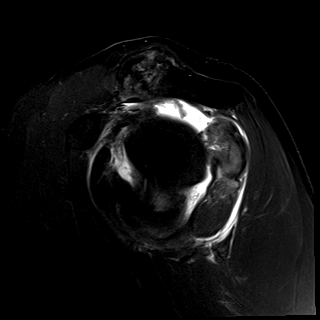
[im 14/25]
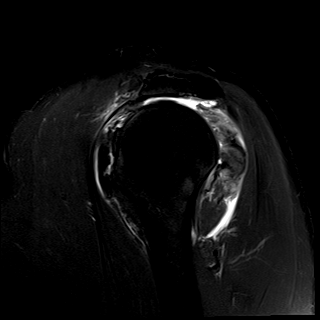
[im 18/25]
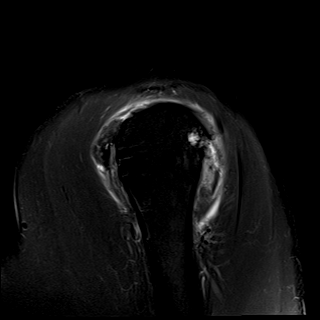
[im 21/25]
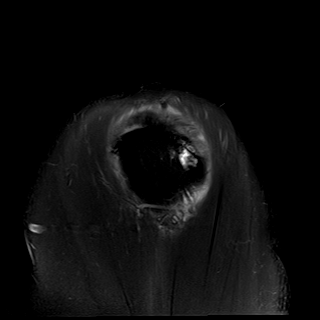
[im 25/25]
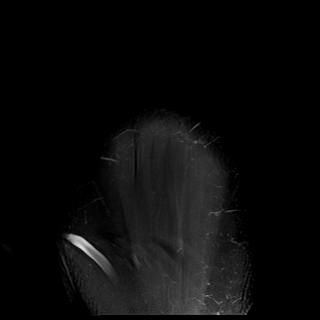

[Series 8: PD · oblique · left · 3.0mm · 0.36mm/px · 8 of 25 slices shown]
[im 1/25]
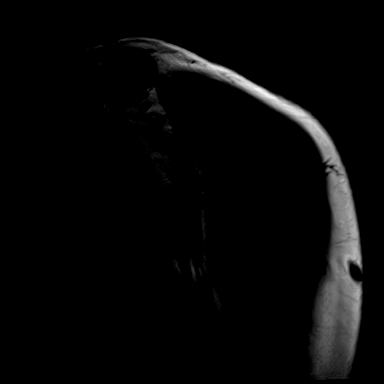
[im 4/25]
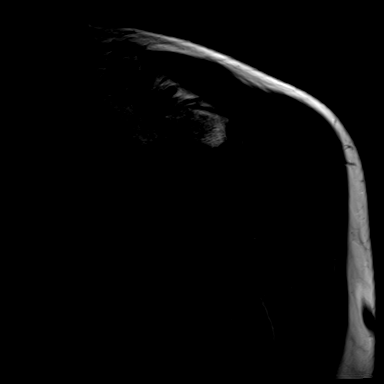
[im 7/25]
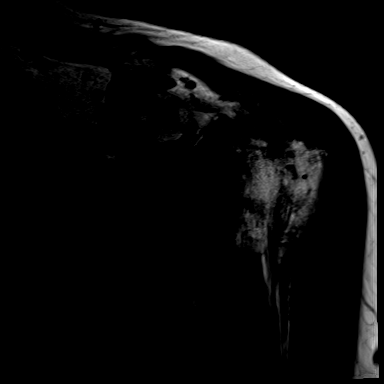
[im 11/25]
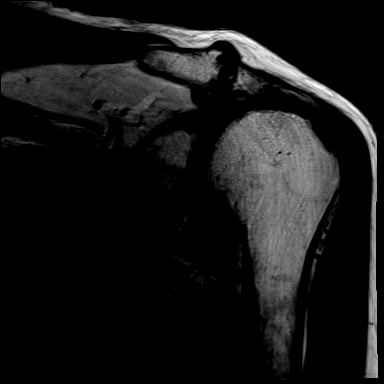
[im 14/25]
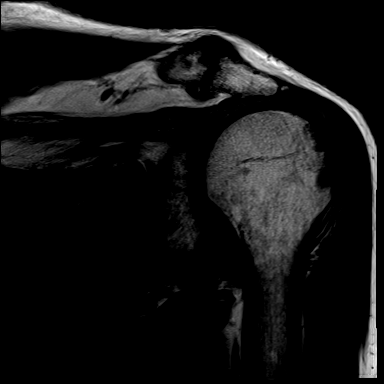
[im 18/25]
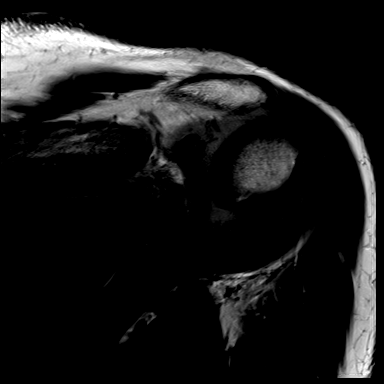
[im 21/25]
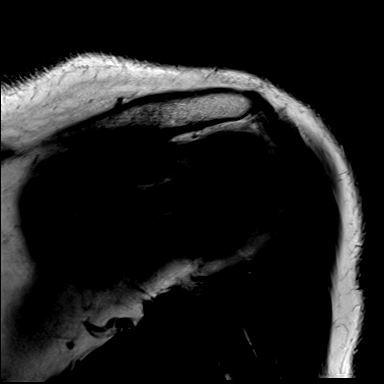
[im 25/25]
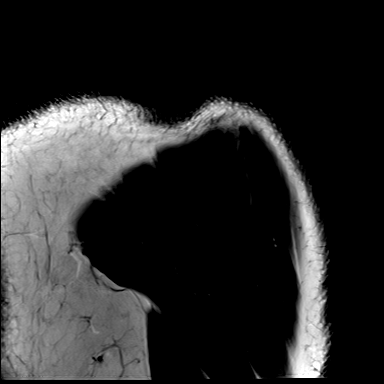

[Series 9: T2 fat-sat · oblique · left · 3.0mm · 0.44mm/px · 8 of 25 slices shown (3 of 3)]
[im 1/25]
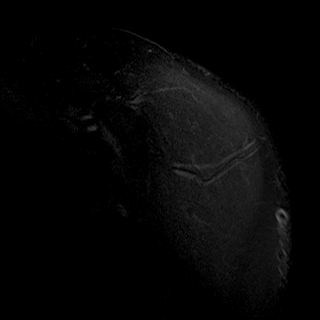
[im 4/25]
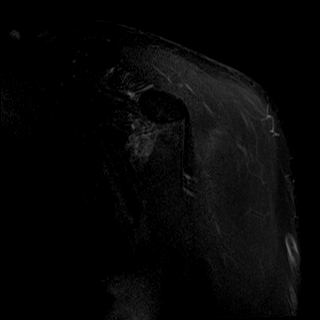
[im 7/25]
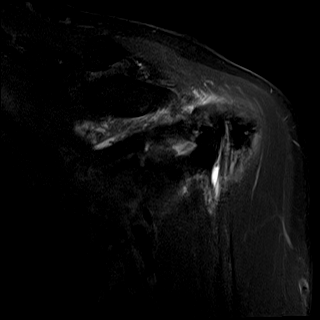
[im 11/25]
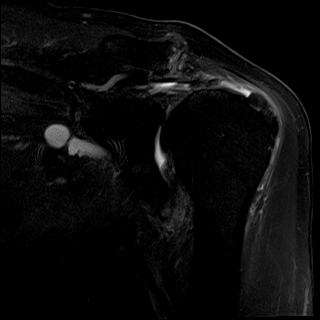
[im 14/25]
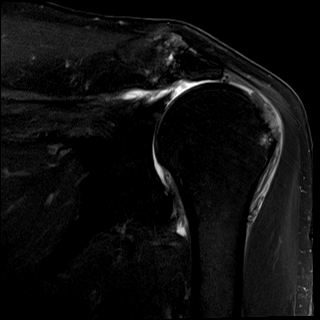
[im 18/25]
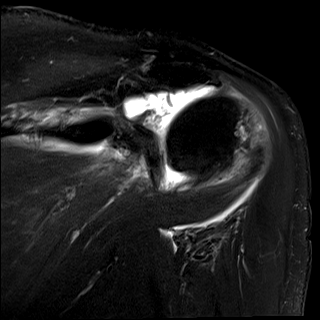
[im 21/25]
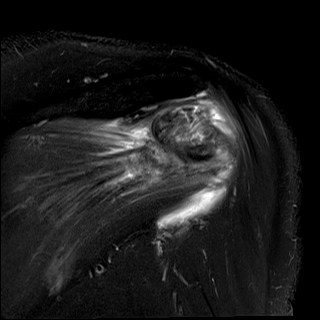
[im 25/25]
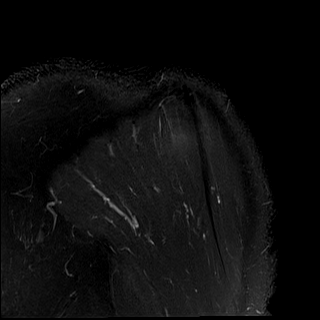

[Series 10: T1 · oblique · left · 3.0mm · 0.36mm/px · 7 of 25 slices shown]
[im 1/25]
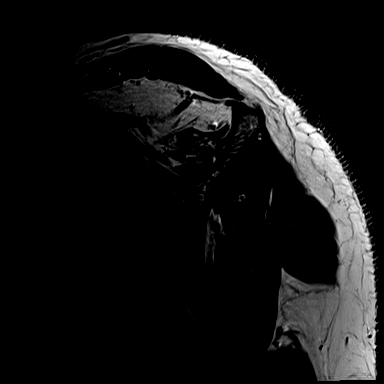
[im 4/25]
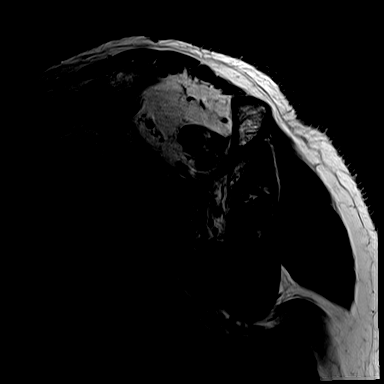
[im 7/25]
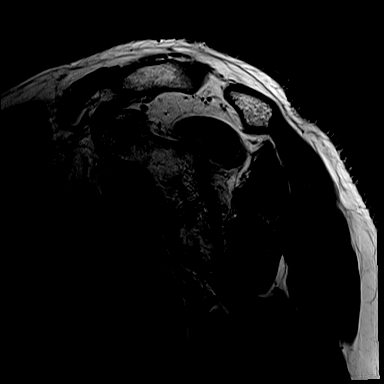
[im 11/25]
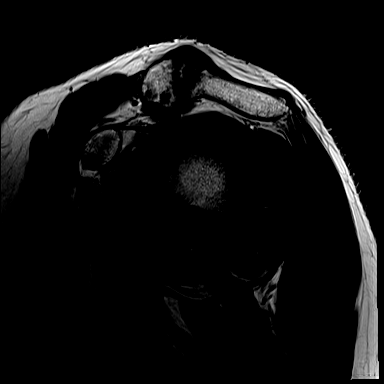
[im 14/25]
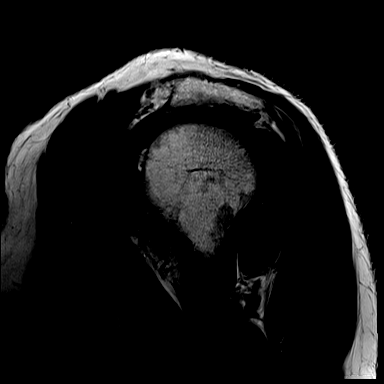
[im 18/25]
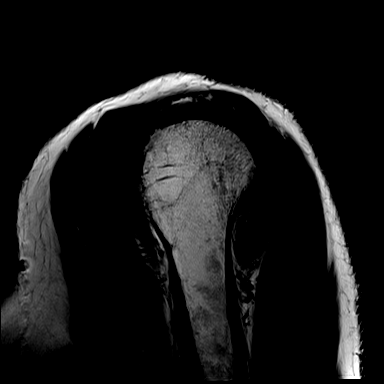
[im 21/25]
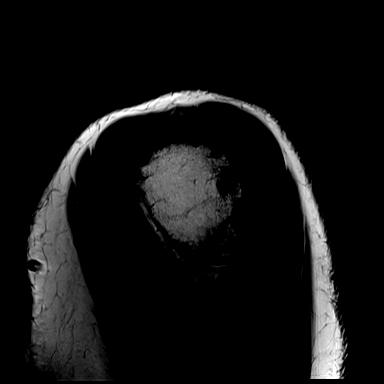

[39 of 40 positions shown; findings below may reference images not displayed]

FINDINGS: Rotator cuff: The patient has complete supraspinatus and
infraspinatus tendon tears with retraction at least to the level of
the glenoid, 4.5 - 5.5 cm. The subscapularis and teres minor
demonstrate tendinopathy but appear intact.

Muscles: There is fatty atrophy of the supraspinatus. Edema is seen
within the infraspinatus which may be due to recent tear or early
atrophy.

Biceps long head: There is a thin remnant of intact tendon in the
bicipital interval which attaches to the superior labrum.
Longitudinal split tearing of the tendon in the bicipital groove is
identified.

Acromioclavicular Joint:  Bulky degenerative disease is present.

Glenohumeral Joint: The humeral head is high-riding secondary to the
patient's rotator cuff tears and abuts the acromion. Loose bodies
identified in the subscapularis recess.

Labrum: The superior labrum is markedly degenerated but no discrete
tear is seen.

Bones: Subacromial spurring is present. No fracture or worrisome
marrow lesion. Fluid is present in the subacromial/subdeltoid bursa.
IMPRESSION: Chronic, complete supraspinatus tendon tear with 4.5-5.5 cm of
retraction and atrophy of the muscle belly.

Complete infraspinatus tendon tear with 4.5 - 5.5 cm of retraction.
Edema within the infraspinatus muscle belly may be due to recent
tear or changes of early atrophy.

Subscapularis and teres minor tendinopathy without tear.

Near complete tear of the intra-articular segment of the long head
of biceps with only a thin remnant of tendon identified. Segment of
tendon in the bicipital groove shows longitudinal split tearing.

Bulky acromioclavicular osteoarthritis. Subacromial spurring also
noted.

Subacromial/subdeltoid fluid consistent with bursitis.

Fluid in the subscapularis recess with a loose body identified as on
the prior study.

## 2016-10-17 DIAGNOSIS — C61 Malignant neoplasm of prostate: Secondary | ICD-10-CM | POA: Diagnosis not present

## 2016-10-17 DIAGNOSIS — R938 Abnormal findings on diagnostic imaging of other specified body structures: Secondary | ICD-10-CM | POA: Diagnosis not present

## 2016-10-17 DIAGNOSIS — Z1389 Encounter for screening for other disorder: Secondary | ICD-10-CM | POA: Diagnosis not present

## 2016-10-17 DIAGNOSIS — M26609 Unspecified temporomandibular joint disorder, unspecified side: Secondary | ICD-10-CM | POA: Diagnosis not present

## 2016-10-17 DIAGNOSIS — I10 Essential (primary) hypertension: Secondary | ICD-10-CM | POA: Diagnosis not present

## 2016-10-17 DIAGNOSIS — Z0000 Encounter for general adult medical examination without abnormal findings: Secondary | ICD-10-CM | POA: Diagnosis not present

## 2016-10-17 DIAGNOSIS — Z79899 Other long term (current) drug therapy: Secondary | ICD-10-CM | POA: Diagnosis not present

## 2016-10-17 DIAGNOSIS — I499 Cardiac arrhythmia, unspecified: Secondary | ICD-10-CM | POA: Diagnosis not present

## 2016-10-17 DIAGNOSIS — J309 Allergic rhinitis, unspecified: Secondary | ICD-10-CM | POA: Diagnosis not present

## 2016-10-17 DIAGNOSIS — D81818 Other biotin-dependent carboxylase deficiency: Secondary | ICD-10-CM | POA: Diagnosis not present

## 2016-10-17 DIAGNOSIS — E782 Mixed hyperlipidemia: Secondary | ICD-10-CM | POA: Diagnosis not present

## 2016-11-13 DIAGNOSIS — L570 Actinic keratosis: Secondary | ICD-10-CM | POA: Diagnosis not present

## 2016-11-13 DIAGNOSIS — Z85828 Personal history of other malignant neoplasm of skin: Secondary | ICD-10-CM | POA: Diagnosis not present

## 2016-11-13 DIAGNOSIS — L821 Other seborrheic keratosis: Secondary | ICD-10-CM | POA: Diagnosis not present

## 2016-11-18 DIAGNOSIS — M7581 Other shoulder lesions, right shoulder: Secondary | ICD-10-CM | POA: Diagnosis not present

## 2016-12-02 DIAGNOSIS — M9905 Segmental and somatic dysfunction of pelvic region: Secondary | ICD-10-CM | POA: Diagnosis not present

## 2016-12-02 DIAGNOSIS — M5136 Other intervertebral disc degeneration, lumbar region: Secondary | ICD-10-CM | POA: Diagnosis not present

## 2016-12-02 DIAGNOSIS — S39012A Strain of muscle, fascia and tendon of lower back, initial encounter: Secondary | ICD-10-CM | POA: Diagnosis not present

## 2016-12-02 DIAGNOSIS — M9902 Segmental and somatic dysfunction of thoracic region: Secondary | ICD-10-CM | POA: Diagnosis not present

## 2016-12-02 DIAGNOSIS — M9903 Segmental and somatic dysfunction of lumbar region: Secondary | ICD-10-CM | POA: Diagnosis not present

## 2016-12-02 DIAGNOSIS — S29012A Strain of muscle and tendon of back wall of thorax, initial encounter: Secondary | ICD-10-CM | POA: Diagnosis not present

## 2016-12-23 DIAGNOSIS — Z961 Presence of intraocular lens: Secondary | ICD-10-CM | POA: Diagnosis not present

## 2016-12-23 DIAGNOSIS — H52203 Unspecified astigmatism, bilateral: Secondary | ICD-10-CM | POA: Diagnosis not present

## 2016-12-23 DIAGNOSIS — H35371 Puckering of macula, right eye: Secondary | ICD-10-CM | POA: Diagnosis not present

## 2017-01-15 DIAGNOSIS — M9903 Segmental and somatic dysfunction of lumbar region: Secondary | ICD-10-CM | POA: Diagnosis not present

## 2017-01-15 DIAGNOSIS — M9902 Segmental and somatic dysfunction of thoracic region: Secondary | ICD-10-CM | POA: Diagnosis not present

## 2017-01-15 DIAGNOSIS — M9905 Segmental and somatic dysfunction of pelvic region: Secondary | ICD-10-CM | POA: Diagnosis not present

## 2017-01-15 DIAGNOSIS — S39012A Strain of muscle, fascia and tendon of lower back, initial encounter: Secondary | ICD-10-CM | POA: Diagnosis not present

## 2017-01-15 DIAGNOSIS — M5136 Other intervertebral disc degeneration, lumbar region: Secondary | ICD-10-CM | POA: Diagnosis not present

## 2017-01-15 DIAGNOSIS — S29012A Strain of muscle and tendon of back wall of thorax, initial encounter: Secondary | ICD-10-CM | POA: Diagnosis not present

## 2017-05-12 DIAGNOSIS — M9902 Segmental and somatic dysfunction of thoracic region: Secondary | ICD-10-CM | POA: Diagnosis not present

## 2017-05-12 DIAGNOSIS — S29012A Strain of muscle and tendon of back wall of thorax, initial encounter: Secondary | ICD-10-CM | POA: Diagnosis not present

## 2017-05-12 DIAGNOSIS — M5136 Other intervertebral disc degeneration, lumbar region: Secondary | ICD-10-CM | POA: Diagnosis not present

## 2017-05-12 DIAGNOSIS — M9905 Segmental and somatic dysfunction of pelvic region: Secondary | ICD-10-CM | POA: Diagnosis not present

## 2017-05-12 DIAGNOSIS — M9903 Segmental and somatic dysfunction of lumbar region: Secondary | ICD-10-CM | POA: Diagnosis not present

## 2017-05-12 DIAGNOSIS — S39012A Strain of muscle, fascia and tendon of lower back, initial encounter: Secondary | ICD-10-CM | POA: Diagnosis not present

## 2017-05-26 DIAGNOSIS — D1801 Hemangioma of skin and subcutaneous tissue: Secondary | ICD-10-CM | POA: Diagnosis not present

## 2017-05-26 DIAGNOSIS — Z85828 Personal history of other malignant neoplasm of skin: Secondary | ICD-10-CM | POA: Diagnosis not present

## 2017-05-26 DIAGNOSIS — L570 Actinic keratosis: Secondary | ICD-10-CM | POA: Diagnosis not present

## 2017-05-26 DIAGNOSIS — L821 Other seborrheic keratosis: Secondary | ICD-10-CM | POA: Diagnosis not present

## 2017-05-26 DIAGNOSIS — L812 Freckles: Secondary | ICD-10-CM | POA: Diagnosis not present

## 2017-07-17 DIAGNOSIS — M1990 Unspecified osteoarthritis, unspecified site: Secondary | ICD-10-CM | POA: Diagnosis not present

## 2017-07-17 DIAGNOSIS — E782 Mixed hyperlipidemia: Secondary | ICD-10-CM | POA: Diagnosis not present

## 2017-07-17 DIAGNOSIS — I10 Essential (primary) hypertension: Secondary | ICD-10-CM | POA: Diagnosis not present

## 2017-07-17 DIAGNOSIS — H3530 Unspecified macular degeneration: Secondary | ICD-10-CM | POA: Diagnosis not present

## 2017-07-17 DIAGNOSIS — Z23 Encounter for immunization: Secondary | ICD-10-CM | POA: Diagnosis not present

## 2017-07-20 DIAGNOSIS — I10 Essential (primary) hypertension: Secondary | ICD-10-CM | POA: Diagnosis not present

## 2017-07-20 DIAGNOSIS — M1990 Unspecified osteoarthritis, unspecified site: Secondary | ICD-10-CM | POA: Diagnosis not present

## 2017-07-20 DIAGNOSIS — E782 Mixed hyperlipidemia: Secondary | ICD-10-CM | POA: Diagnosis not present

## 2017-07-20 DIAGNOSIS — C61 Malignant neoplasm of prostate: Secondary | ICD-10-CM | POA: Diagnosis not present

## 2017-11-03 DIAGNOSIS — H3530 Unspecified macular degeneration: Secondary | ICD-10-CM | POA: Diagnosis not present

## 2017-11-03 DIAGNOSIS — F4321 Adjustment disorder with depressed mood: Secondary | ICD-10-CM | POA: Diagnosis not present

## 2017-11-03 DIAGNOSIS — E782 Mixed hyperlipidemia: Secondary | ICD-10-CM | POA: Diagnosis not present

## 2017-11-03 DIAGNOSIS — Z79899 Other long term (current) drug therapy: Secondary | ICD-10-CM | POA: Diagnosis not present

## 2017-11-03 DIAGNOSIS — M1990 Unspecified osteoarthritis, unspecified site: Secondary | ICD-10-CM | POA: Diagnosis not present

## 2017-11-03 DIAGNOSIS — Z0000 Encounter for general adult medical examination without abnormal findings: Secondary | ICD-10-CM | POA: Diagnosis not present

## 2017-11-03 DIAGNOSIS — D81818 Other biotin-dependent carboxylase deficiency: Secondary | ICD-10-CM | POA: Diagnosis not present

## 2017-11-03 DIAGNOSIS — Z1389 Encounter for screening for other disorder: Secondary | ICD-10-CM | POA: Diagnosis not present

## 2017-11-03 DIAGNOSIS — I10 Essential (primary) hypertension: Secondary | ICD-10-CM | POA: Diagnosis not present

## 2017-11-17 DIAGNOSIS — M9903 Segmental and somatic dysfunction of lumbar region: Secondary | ICD-10-CM | POA: Diagnosis not present

## 2017-11-17 DIAGNOSIS — S29012A Strain of muscle and tendon of back wall of thorax, initial encounter: Secondary | ICD-10-CM | POA: Diagnosis not present

## 2017-11-17 DIAGNOSIS — M9902 Segmental and somatic dysfunction of thoracic region: Secondary | ICD-10-CM | POA: Diagnosis not present

## 2017-11-17 DIAGNOSIS — S39012A Strain of muscle, fascia and tendon of lower back, initial encounter: Secondary | ICD-10-CM | POA: Diagnosis not present

## 2017-11-17 DIAGNOSIS — M9905 Segmental and somatic dysfunction of pelvic region: Secondary | ICD-10-CM | POA: Diagnosis not present

## 2017-11-17 DIAGNOSIS — M5136 Other intervertebral disc degeneration, lumbar region: Secondary | ICD-10-CM | POA: Diagnosis not present

## 2017-12-04 DIAGNOSIS — L570 Actinic keratosis: Secondary | ICD-10-CM | POA: Diagnosis not present

## 2017-12-04 DIAGNOSIS — C490 Malignant neoplasm of connective and soft tissue of head, face and neck: Secondary | ICD-10-CM | POA: Diagnosis not present

## 2017-12-04 DIAGNOSIS — Z85828 Personal history of other malignant neoplasm of skin: Secondary | ICD-10-CM | POA: Diagnosis not present

## 2017-12-04 DIAGNOSIS — D485 Neoplasm of uncertain behavior of skin: Secondary | ICD-10-CM | POA: Diagnosis not present

## 2017-12-24 DIAGNOSIS — C4442 Squamous cell carcinoma of skin of scalp and neck: Secondary | ICD-10-CM | POA: Diagnosis not present

## 2017-12-24 DIAGNOSIS — Z85828 Personal history of other malignant neoplasm of skin: Secondary | ICD-10-CM | POA: Diagnosis not present

## 2017-12-24 DIAGNOSIS — L570 Actinic keratosis: Secondary | ICD-10-CM | POA: Diagnosis not present

## 2017-12-29 DIAGNOSIS — H35371 Puckering of macula, right eye: Secondary | ICD-10-CM | POA: Diagnosis not present

## 2017-12-29 DIAGNOSIS — Z961 Presence of intraocular lens: Secondary | ICD-10-CM | POA: Diagnosis not present

## 2017-12-29 DIAGNOSIS — H52203 Unspecified astigmatism, bilateral: Secondary | ICD-10-CM | POA: Diagnosis not present

## 2018-01-05 DIAGNOSIS — F4321 Adjustment disorder with depressed mood: Secondary | ICD-10-CM | POA: Diagnosis not present

## 2018-01-05 DIAGNOSIS — H3530 Unspecified macular degeneration: Secondary | ICD-10-CM | POA: Diagnosis not present

## 2018-01-05 DIAGNOSIS — M1990 Unspecified osteoarthritis, unspecified site: Secondary | ICD-10-CM | POA: Diagnosis not present

## 2018-01-05 DIAGNOSIS — Z79899 Other long term (current) drug therapy: Secondary | ICD-10-CM | POA: Diagnosis not present

## 2018-01-05 DIAGNOSIS — E782 Mixed hyperlipidemia: Secondary | ICD-10-CM | POA: Diagnosis not present

## 2018-01-05 DIAGNOSIS — D81818 Other biotin-dependent carboxylase deficiency: Secondary | ICD-10-CM | POA: Diagnosis not present

## 2018-01-05 DIAGNOSIS — I10 Essential (primary) hypertension: Secondary | ICD-10-CM | POA: Diagnosis not present

## 2018-02-23 DIAGNOSIS — J069 Acute upper respiratory infection, unspecified: Secondary | ICD-10-CM | POA: Diagnosis not present

## 2018-03-30 DIAGNOSIS — M1990 Unspecified osteoarthritis, unspecified site: Secondary | ICD-10-CM | POA: Diagnosis not present

## 2018-03-30 DIAGNOSIS — H3530 Unspecified macular degeneration: Secondary | ICD-10-CM | POA: Diagnosis not present

## 2018-03-30 DIAGNOSIS — E782 Mixed hyperlipidemia: Secondary | ICD-10-CM | POA: Diagnosis not present

## 2018-03-30 DIAGNOSIS — I10 Essential (primary) hypertension: Secondary | ICD-10-CM | POA: Diagnosis not present

## 2018-03-30 DIAGNOSIS — F4321 Adjustment disorder with depressed mood: Secondary | ICD-10-CM | POA: Diagnosis not present

## 2018-03-30 DIAGNOSIS — D81818 Other biotin-dependent carboxylase deficiency: Secondary | ICD-10-CM | POA: Diagnosis not present

## 2018-03-30 DIAGNOSIS — J069 Acute upper respiratory infection, unspecified: Secondary | ICD-10-CM | POA: Diagnosis not present

## 2018-03-30 DIAGNOSIS — Z79899 Other long term (current) drug therapy: Secondary | ICD-10-CM | POA: Diagnosis not present

## 2018-06-08 DIAGNOSIS — L821 Other seborrheic keratosis: Secondary | ICD-10-CM | POA: Diagnosis not present

## 2018-06-08 DIAGNOSIS — Z85828 Personal history of other malignant neoplasm of skin: Secondary | ICD-10-CM | POA: Diagnosis not present

## 2018-06-08 DIAGNOSIS — L570 Actinic keratosis: Secondary | ICD-10-CM | POA: Diagnosis not present

## 2018-06-08 DIAGNOSIS — L438 Other lichen planus: Secondary | ICD-10-CM | POA: Diagnosis not present

## 2018-06-08 DIAGNOSIS — D1801 Hemangioma of skin and subcutaneous tissue: Secondary | ICD-10-CM | POA: Diagnosis not present

## 2018-06-08 DIAGNOSIS — L812 Freckles: Secondary | ICD-10-CM | POA: Diagnosis not present

## 2018-07-20 DIAGNOSIS — J209 Acute bronchitis, unspecified: Secondary | ICD-10-CM | POA: Diagnosis not present

## 2018-07-31 DIAGNOSIS — Z23 Encounter for immunization: Secondary | ICD-10-CM | POA: Diagnosis not present

## 2018-11-24 DIAGNOSIS — Z1389 Encounter for screening for other disorder: Secondary | ICD-10-CM | POA: Diagnosis not present

## 2018-11-24 DIAGNOSIS — Z23 Encounter for immunization: Secondary | ICD-10-CM | POA: Diagnosis not present

## 2018-11-24 DIAGNOSIS — H3530 Unspecified macular degeneration: Secondary | ICD-10-CM | POA: Diagnosis not present

## 2018-11-24 DIAGNOSIS — Z125 Encounter for screening for malignant neoplasm of prostate: Secondary | ICD-10-CM | POA: Diagnosis not present

## 2018-11-24 DIAGNOSIS — Z79899 Other long term (current) drug therapy: Secondary | ICD-10-CM | POA: Diagnosis not present

## 2018-11-24 DIAGNOSIS — Z0000 Encounter for general adult medical examination without abnormal findings: Secondary | ICD-10-CM | POA: Diagnosis not present

## 2018-11-24 DIAGNOSIS — M1990 Unspecified osteoarthritis, unspecified site: Secondary | ICD-10-CM | POA: Diagnosis not present

## 2018-11-24 DIAGNOSIS — F4321 Adjustment disorder with depressed mood: Secondary | ICD-10-CM | POA: Diagnosis not present

## 2018-11-24 DIAGNOSIS — I10 Essential (primary) hypertension: Secondary | ICD-10-CM | POA: Diagnosis not present

## 2018-11-24 DIAGNOSIS — E782 Mixed hyperlipidemia: Secondary | ICD-10-CM | POA: Diagnosis not present

## 2018-11-24 DIAGNOSIS — D81818 Other biotin-dependent carboxylase deficiency: Secondary | ICD-10-CM | POA: Diagnosis not present

## 2018-12-14 DIAGNOSIS — L570 Actinic keratosis: Secondary | ICD-10-CM | POA: Diagnosis not present

## 2018-12-14 DIAGNOSIS — Z85828 Personal history of other malignant neoplasm of skin: Secondary | ICD-10-CM | POA: Diagnosis not present

## 2018-12-14 DIAGNOSIS — D044 Carcinoma in situ of skin of scalp and neck: Secondary | ICD-10-CM | POA: Diagnosis not present

## 2018-12-14 DIAGNOSIS — D485 Neoplasm of uncertain behavior of skin: Secondary | ICD-10-CM | POA: Diagnosis not present

## 2018-12-14 DIAGNOSIS — L821 Other seborrheic keratosis: Secondary | ICD-10-CM | POA: Diagnosis not present

## 2019-03-07 DIAGNOSIS — H35371 Puckering of macula, right eye: Secondary | ICD-10-CM | POA: Diagnosis not present

## 2019-03-07 DIAGNOSIS — H52203 Unspecified astigmatism, bilateral: Secondary | ICD-10-CM | POA: Diagnosis not present

## 2019-03-07 DIAGNOSIS — Z961 Presence of intraocular lens: Secondary | ICD-10-CM | POA: Diagnosis not present

## 2019-06-01 DIAGNOSIS — F5109 Other insomnia not due to a substance or known physiological condition: Secondary | ICD-10-CM | POA: Diagnosis not present

## 2019-06-01 DIAGNOSIS — Z658 Other specified problems related to psychosocial circumstances: Secondary | ICD-10-CM | POA: Diagnosis not present

## 2019-06-09 DIAGNOSIS — Z23 Encounter for immunization: Secondary | ICD-10-CM | POA: Diagnosis not present

## 2019-06-21 DIAGNOSIS — L853 Xerosis cutis: Secondary | ICD-10-CM | POA: Diagnosis not present

## 2019-06-21 DIAGNOSIS — Z85828 Personal history of other malignant neoplasm of skin: Secondary | ICD-10-CM | POA: Diagnosis not present

## 2019-06-21 DIAGNOSIS — L570 Actinic keratosis: Secondary | ICD-10-CM | POA: Diagnosis not present

## 2019-06-21 DIAGNOSIS — L812 Freckles: Secondary | ICD-10-CM | POA: Diagnosis not present

## 2019-06-21 DIAGNOSIS — L821 Other seborrheic keratosis: Secondary | ICD-10-CM | POA: Diagnosis not present

## 2019-07-05 DIAGNOSIS — F411 Generalized anxiety disorder: Secondary | ICD-10-CM | POA: Diagnosis not present

## 2019-07-05 DIAGNOSIS — Z87891 Personal history of nicotine dependence: Secondary | ICD-10-CM | POA: Diagnosis not present

## 2019-07-05 DIAGNOSIS — R062 Wheezing: Secondary | ICD-10-CM | POA: Diagnosis not present

## 2019-07-15 ENCOUNTER — Other Ambulatory Visit: Payer: Self-pay | Admitting: Internal Medicine

## 2019-07-15 ENCOUNTER — Ambulatory Visit
Admission: RE | Admit: 2019-07-15 | Discharge: 2019-07-15 | Disposition: A | Discharge status: Home / Self Care | Payer: Medicare Other | Source: Ambulatory Visit | Attending: Internal Medicine | Admitting: Internal Medicine

## 2019-07-15 DIAGNOSIS — R0601 Orthopnea: Secondary | ICD-10-CM | POA: Diagnosis not present

## 2019-07-15 DIAGNOSIS — R0602 Shortness of breath: Secondary | ICD-10-CM | POA: Diagnosis not present

## 2019-07-15 DIAGNOSIS — F411 Generalized anxiety disorder: Secondary | ICD-10-CM | POA: Diagnosis not present

## 2019-07-15 DIAGNOSIS — R062 Wheezing: Secondary | ICD-10-CM | POA: Diagnosis not present

## 2019-07-15 DIAGNOSIS — I10 Essential (primary) hypertension: Secondary | ICD-10-CM | POA: Diagnosis not present

## 2019-07-15 DIAGNOSIS — Z87891 Personal history of nicotine dependence: Secondary | ICD-10-CM | POA: Diagnosis not present

## 2019-07-18 ENCOUNTER — Other Ambulatory Visit: Payer: Self-pay | Admitting: Internal Medicine

## 2019-07-18 DIAGNOSIS — R0601 Orthopnea: Secondary | ICD-10-CM

## 2019-07-27 DIAGNOSIS — R062 Wheezing: Secondary | ICD-10-CM | POA: Diagnosis not present

## 2019-07-27 DIAGNOSIS — R0601 Orthopnea: Secondary | ICD-10-CM | POA: Diagnosis not present

## 2019-07-27 DIAGNOSIS — Z87891 Personal history of nicotine dependence: Secondary | ICD-10-CM | POA: Diagnosis not present

## 2019-07-27 DIAGNOSIS — I509 Heart failure, unspecified: Secondary | ICD-10-CM | POA: Diagnosis not present

## 2019-07-27 DIAGNOSIS — I10 Essential (primary) hypertension: Secondary | ICD-10-CM | POA: Diagnosis not present

## 2019-07-27 DIAGNOSIS — R0602 Shortness of breath: Secondary | ICD-10-CM | POA: Diagnosis not present

## 2019-08-02 ENCOUNTER — Other Ambulatory Visit: Payer: Self-pay

## 2019-08-02 ENCOUNTER — Ambulatory Visit: Payer: Medicare Other

## 2019-08-02 DIAGNOSIS — R0601 Orthopnea: Secondary | ICD-10-CM | POA: Diagnosis not present

## 2019-08-12 ENCOUNTER — Ambulatory Visit (INDEPENDENT_AMBULATORY_CARE_PROVIDER_SITE_OTHER): Payer: Medicare Other | Admitting: Pulmonary Disease

## 2019-08-12 ENCOUNTER — Other Ambulatory Visit: Payer: Self-pay

## 2019-08-12 ENCOUNTER — Encounter: Payer: Self-pay | Admitting: Pulmonary Disease

## 2019-08-12 VITALS — BP 124/82 | HR 85 | Temp 97.6°F | Ht 70.0 in | Wt 158.6 lb

## 2019-08-12 DIAGNOSIS — R0600 Dyspnea, unspecified: Secondary | ICD-10-CM | POA: Diagnosis not present

## 2019-08-12 NOTE — Patient Instructions (Signed)
We will schedule you for a lung function test for evaluation of your lungs I have reviewed the heart test that he had done last week.  It shows severely reduced heart function Please check with Dr. Inda Merlin about further follow-up.  You may need referral to cardiology  Follow-up in clinic in 4 to 8 weeks after completion of pulmonary function test.

## 2019-08-12 NOTE — Progress Notes (Signed)
Corey Harrison    [id number]    Apr 17, 1929  Primary Care Physician:Gates, Herbie [place], MD  Referring Physician: Josetta Huddle, MD [address] [place],  [place] [address]  Chief complaint: Consult for dyspnea  HPI: 85 year old with history of hyperlipidemia, hypertension, prostate cancer.  Complains of intermittent dyspnea on exertion over the past 2 months.  He has occasional symptoms at rest.  Denies any lower extremity edema, orthopnea, PND.  Recent chest x-ray showed mild cardiomegaly and follow-up echocardiogram reviewed with severe cardiomyopathy  Has problems with anxiety, mood disorder symptoms his wife passed away in 01/20/18 and due to isolation from COVID-19  Pets: No pets Occupation: Retired Optometrist Exposures: No known exposures.  No mold, hot tub, Jacuzzi.  No down pillows or comforters Smoking history: 50-pack-year smoker.  Quit in 01/21/95 Travel history: No significant travel history Relevant family history: No significant family history of lung disease  Outpatient Encounter Medications as of 08/12/2019  Medication Sig  . furosemide (LASIX) 20 MG tablet Take 20 mg by mouth daily.   . Glucosamine 500 MG CAPS Take 1 capsule by mouth daily.  Marland Kitchen losartan (COZAAR) 100 MG tablet Take 100 mg by mouth daily.   . Melatonin 5 MG TABS Take 1 tablet by mouth daily.  . Multiple Vitamin (MULTIVITAMIN WITH MINERALS) TABS tablet Take 1 tablet by mouth daily.  . Multiple Vitamins-Minerals (PRESERVISION AREDS PO) Take 1 tablet by mouth daily.  . potassium chloride (KLOR-CON) 10 MEQ tablet Take 10 mEq by mouth daily.   . simvastatin (ZOCOR) 20 MG tablet Take 20 mg by mouth every morning.  . [DISCONTINUED] diphenhydramine-acetaminophen (TYLENOL PM) 25-500 MG TABS Take 1 tablet by mouth at bedtime as needed (pain/sleep).  . [DISCONTINUED] aspirin EC 81 MG tablet Take 81 mg by mouth every other day.  . [DISCONTINUED] valsartan (DIOVAN) 80 MG tablet Take 80 mg by mouth  every morning.   No facility-administered encounter medications on file as of 08/12/2019.     Allergies as of 08/12/2019  . (No Known Allergies)    Past Medical History:  Diagnosis Date  . Cancer [facility]) 1996   prostate  . Hypertension     Past Surgical History:  Procedure Laterality Date  . CARPAL TUNNEL RELEASE Right dec 2005  . CARPAL TUNNEL RELEASE Left jan 2006  . COLONOSCOPY WITH PROPOFOL N/A 12/20/2013   Procedure: COLONOSCOPY WITH PROPOFOL;  Surgeon: Garlan Fair, MD;  Location: [facility] ENDOSCOPY;  Service: Endoscopy;  Laterality: N/A;  . endoscopic sinus surgery  sept 1993  . EYE SURGERY  06-2012   right eye cataract  . HERNIA REPAIR  oct 1996   diaphragmatic  . HERNIA REPAIR Right jan 2003  . left hernia repair  dec 2007  . prostate surgery for cancer  02-1998  . rectal fistula repair  01-20-1973  . ROTATOR CUFF REPAIR Left     History reviewed. No pertinent family history.  Social History   Socioeconomic History  . Marital status: Married    Spouse name: Not on file  . Number of children: Not on file  . Years of education: Not on file  . Highest education level: Not on file  Occupational History  . Not on file  Social Needs  . Financial resource strain: Not on file  . Food insecurity    Worry: Not on file    Inability: Not on file  . Transportation needs    Medical: Not on  file    Non-medical: Not on file  Tobacco Use  . Smoking status: Former Smoker    Packs/day: 1.00    Years: 45.00    Pack years: 45.00    Types: Cigarettes    Quit date: 06/30/1995    Years since quitting: 24.1  . Smokeless tobacco: Never Used  Substance and Sexual Activity  . Alcohol use: Yes    Comment: daily 3 oz alcohol  . Drug use: No  . Sexual activity: Not on file  Lifestyle  . Physical activity    Days per week: Not on file    Minutes per session: Not on file  . Stress: Not on file  Relationships  . Social Herbalist on phone: Not on file    Gets together:  Not on file    Attends religious service: Not on file    Active member of club or organization: Not on file    Attends meetings of clubs or organizations: Not on file    Relationship status: Not on file  . Intimate partner violence    Fear of current or ex partner: Not on file    Emotionally abused: Not on file    Physically abused: Not on file    Forced sexual activity: Not on file  Other Topics Concern  . Not on file  Social History Narrative  . Not on file    Review of systems: Review of Systems  Constitutional: Negative for fever and chills.  HENT: Negative.   Eyes: Negative for blurred vision.  Respiratory: as per HPI  Cardiovascular: Negative for chest pain and palpitations.  Gastrointestinal: Negative for vomiting, diarrhea, blood per rectum. Genitourinary: Negative for dysuria, urgency, frequency and hematuria.  Musculoskeletal: Negative for myalgias, back pain and joint pain.  Skin: Negative for itching and rash.  Neurological: Negative for dizziness, tremors, focal weakness, seizures and loss of consciousness.  Endo/Heme/Allergies: Negative for environmental allergies.  Psychiatric/Behavioral: Negative for depression, suicidal ideas and hallucinations.  All other systems reviewed and are negative.  Physical Exam: Blood pressure 124/82, pulse 85, temperature 97.6 F (36.4 C), temperature source Temporal, height 5\' 10"  (1.778 m), weight 158 lb 9.6 oz (71.9 kg), SpO2 96 %. Gen:      No acute distress HEENT:  EOMI, sclera anicteric Neck:     No masses; no thyromegaly Lungs:    Clear to auscultation bilaterally; normal respiratory effort CV:         Regular rate and rhythm; no murmurs Abd:      + bowel sounds; soft, non-tender; no palpable masses, no distension Ext:    No edema; adequate peripheral perfusion Skin:      Warm and dry; no rash Neuro: alert and oriented x 3 Psych: normal mood and affect  Data Reviewed: Imaging: Chest x-ray 12/16/14 right base density  with left base scarring CT chest 12/02/2014 improvement in groundglass opacities in the lower lobes Chest x-ray 07/15/2019-mild cardiomegaly, hyperinflation with mild basal lung scarring. I have reviewed the images personally  Cardiac: Echocardiogram 09/28/2015-LVEF 0000000, grade 1 diastolic dysfunction.  PA systolic pressure within normal limits.  RV systolic function is normal.  Echocardiogram 08/02/2019-Severe concentric hypertrophy of the left ventricle with speckled pattern. Consider amyloidosis. Severe global hypokinesis with severely depressed LV systolic function with EF 20%. No definite evidence of apical thrombus on this non contrast study.  Moderate reduction in RV function.   Assessment:  Evaluation for dyspnea Has new onset cardiomyopathy with severe concentric LVH  with concern for amyloidosis.  Suspect that this is the primary reason for his presentation He has follow-up with Dr. Inda Merlin his primary care in a few weeks to review his echo results.  He will need referral to cardiology.  We will evaluate for COPD given his smoking history with PFTs There may be a question of basal scarring on CXR.  If PFTs show restriction or diffusion impairment we will get high-res CT  Plan/Recommendations: - Follow-up with primary care. Will need cardiology evaluation - Schedule pulmonary function test  Marshell Garfinkel MD [facility] 08/12/2019, 10:21 AM  CC: Josetta Huddle, MD

## 2019-09-07 ENCOUNTER — Ambulatory Visit: Payer: Medicare Other | Admitting: Cardiology

## 2019-09-12 ENCOUNTER — Telehealth (HOSPITAL_COMMUNITY): Payer: Self-pay

## 2019-09-12 ENCOUNTER — Ambulatory Visit (HOSPITAL_COMMUNITY)
Admission: RE | Admit: 2019-09-12 | Discharge: 2019-09-12 | Disposition: A | Discharge status: Home / Self Care | Payer: Medicare Other | Source: Ambulatory Visit | Attending: Cardiology | Admitting: Cardiology

## 2019-09-12 ENCOUNTER — Encounter (HOSPITAL_COMMUNITY): Payer: Self-pay | Admitting: Cardiology

## 2019-09-12 ENCOUNTER — Other Ambulatory Visit (HOSPITAL_COMMUNITY): Payer: Self-pay

## 2019-09-12 ENCOUNTER — Other Ambulatory Visit: Payer: Self-pay

## 2019-09-12 VITALS — BP 110/90 | HR 97 | Wt 155.0 lb

## 2019-09-12 DIAGNOSIS — I5022 Chronic systolic (congestive) heart failure: Secondary | ICD-10-CM

## 2019-09-12 DIAGNOSIS — E859 Amyloidosis, unspecified: Secondary | ICD-10-CM | POA: Insufficient documentation

## 2019-09-12 DIAGNOSIS — E785 Hyperlipidemia, unspecified: Secondary | ICD-10-CM | POA: Insufficient documentation

## 2019-09-12 DIAGNOSIS — Z87891 Personal history of nicotine dependence: Secondary | ICD-10-CM | POA: Diagnosis not present

## 2019-09-12 DIAGNOSIS — I444 Left anterior fascicular block: Secondary | ICD-10-CM | POA: Diagnosis not present

## 2019-09-12 DIAGNOSIS — R9431 Abnormal electrocardiogram [ECG] [EKG]: Secondary | ICD-10-CM | POA: Diagnosis not present

## 2019-09-12 DIAGNOSIS — I110 Hypertensive heart disease with heart failure: Secondary | ICD-10-CM | POA: Insufficient documentation

## 2019-09-12 DIAGNOSIS — Z8546 Personal history of malignant neoplasm of prostate: Secondary | ICD-10-CM | POA: Diagnosis not present

## 2019-09-12 DIAGNOSIS — Z79899 Other long term (current) drug therapy: Secondary | ICD-10-CM | POA: Diagnosis not present

## 2019-09-12 LAB — CBC
HCT: 46.9 % (ref 39.0–52.0)
Hemoglobin: 15.8 g/dL (ref 13.0–17.0)
MCH: 33.6 pg (ref 26.0–34.0)
MCHC: 33.7 g/dL (ref 30.0–36.0)
MCV: 99.8 fL (ref 80.0–100.0)
Platelets: 257 10*3/uL (ref 150–400)
RBC: 4.7 MIL/uL (ref 4.22–5.81)
RDW: 14.6 % (ref 11.5–15.5)
WBC: 8.1 10*3/uL (ref 4.0–10.5)
nRBC: 0 % (ref 0.0–0.2)

## 2019-09-12 LAB — BASIC METABOLIC PANEL
Anion gap: 12 (ref 5–15)
BUN: 34 mg/dL — ABNORMAL HIGH (ref 8–23)
CO2: 25 mmol/L (ref 22–32)
Calcium: 9.6 mg/dL (ref 8.9–10.3)
Chloride: 103 mmol/L (ref 98–111)
Creatinine, Ser: 1.33 mg/dL — ABNORMAL HIGH (ref 0.61–1.24)
GFR calc Af Amer: 55 mL/min — ABNORMAL LOW (ref >60)
GFR calc non Af Amer: 47 mL/min — ABNORMAL LOW (ref >60)
Glucose, Bld: 105 mg/dL — ABNORMAL HIGH (ref 70–99)
Potassium: 4.6 mmol/L (ref 3.5–5.1)
Sodium: 140 mmol/L (ref 135–145)

## 2019-09-12 LAB — PROTIME-INR
INR: 1.1 (ref 0.8–1.2)
Prothrombin Time: 13.6 seconds (ref 11.4–15.2)

## 2019-09-12 MED ORDER — FUROSEMIDE 20 MG PO TABS: 20.0000 mg | ORAL_TABLET | ORAL | 5 refills | Status: DC

## 2019-09-12 MED ORDER — CARVEDILOL 3.125 MG PO TABS: 3.1250 mg | ORAL_TABLET | Freq: Two times a day (BID) | ORAL | 3 refills | Status: DC

## 2019-09-12 MED ORDER — SODIUM CHLORIDE 0.9% FLUSH: 3.0000 mL | Freq: Two times a day (BID) | INTRAVENOUS | Status: DC

## 2019-09-12 MED ORDER — SACUBITRIL-VALSARTAN 24-26 MG PO TABS: 1.0000 | ORAL_TABLET | Freq: Two times a day (BID) | ORAL | 5 refills | Status: DC

## 2019-09-12 NOTE — Telephone Encounter (Signed)
Pt aware of lab results and recommendations to decrease lasix to every other day.  Will rtc in 10 days to repeat bmet. Verbalized understanding

## 2019-09-12 NOTE — Patient Instructions (Addendum)
Stop Losartan  Stop Potassium  Start Carvedilol 3.125 mg Twice daily   Start Entresto 24/26 mg Twice daily   Labs done today, we will notify you of abnormal results  Labs in 10 days  Heart Catheterization, see instructions below  You have been ordered a PYP Scan.  This is done in the Radiology Department of [facility].  When you come for this test please plan to be there 2-3 hours.  Your physician recommends that you schedule a follow-up appointment in: 1 week after your heart catheterization    HEART CATHETERIZATION INSTRUCTIONS:  You are scheduled for a Cardiac Catheterization on Wednesday, December 30 with Dr. Loralie Champagne.  1. Please arrive at the [facility] (Main Entrance A) at [facility]: [address] [place], [place] [address] at 8:00 AM (This time is two hours before your procedure to ensure your preparation). Free valet parking service is available.   Special note: Every effort is made to have your procedure done on time. Please understand that emergencies sometimes delay scheduled procedures.  2. Diet: Do not eat solid foods after midnight.  The patient may have clear liquids until 5am upon the day of the procedure.  3. COVID TEST:  Monday 09/26/2019 at 9 am, after this test please return home and self quarantine until you come for your test on Wed 12/30.  4. Medication instructions in preparation for your procedure:   DO NOT TAKE FUROSEMIDE AM OF TEST, all other medications maybe taken with a sip of water.  5. Plan for one night stay--bring personal belongings. 6. Bring a current list of your medications and current insurance cards. 7. You MUST have a responsible person to drive you home. 8. Someone MUST be with you the first 24 hours after you arrive home or your discharge will be delayed. 9. Please wear clothes that are easy to get on and off and wear slip-on shoes.  Thank you for allowing [place] to care for you!   -- [facility]

## 2019-09-12 NOTE — Telephone Encounter (Signed)
-----   Message from Larey Dresser, MD sent at 09/12/2019  3:01 PM EST ----- He can decrease Lasix to every other day with addition of Entresto. Creatinine is a little elevated. BMET 10 days.

## 2019-09-13 ENCOUNTER — Telehealth: Payer: Self-pay | Admitting: Pulmonary Disease

## 2019-09-13 NOTE — Addendum Note (Signed)
Encounter addended by: Larey Dresser, MD on: 09/13/2019 12:29 AM  Actions taken: Clinical Note Signed, Level of Service modified

## 2019-09-13 NOTE — Telephone Encounter (Signed)
lmtcb for pt. Is he calling to cancel or reschedule?  Because he cannot have his PFT done without his Covid test.

## 2019-09-13 NOTE — Progress Notes (Signed)
PCP: Dr. Inda Merlin Cardiology: Dr. Aundra Dubin  86 yo was referred by Dr. Inda Merlin for evaluation of CHF.  I took care of his wife in the past.  He had been generally health up until this fall.  He developed worsening fatigue as well as exertional dyspnea.  His PCP started him on Lasix 20 mg daily, which helped his breathing.  Echo was done (cannot review as done at non-[facility] facility) showing severe LVH with a speckled pattern concerning for amyloidosis.  Also noted was EF of 20%, diffuse hypokinesis, and moderate RV dysfunction.    He is still exercising daily in his house but noting more fatigue.  He had orthopnea until he started Lasix, now it has resolved.  Weight has been trending down.  Poor appetite. He is short of breath after walking about 50 yards, this is also improved on Lasix. No chest pain.  No lightheadedness. He gets tingling in his feet at times. No history of carpal tunnel syndrome.   ECG (personally reviewed): NSR, PVCs, LVH, ?old ASMI.   Labs (12/20): K 52, creatinine 1.26, BNP 628, hgb 14.4  PMH: 1. HTN 2. Hyperlipidemia 3. Prostate cancer 4. Osteoarthritis 5. Macular degeneration.  6. Chronic systolic CHF:  - Echo (Q000111Q): EF 55-60%, normal RV.  - Echo (11/20): Severe LVH with speckled pattern concerning for amyloidosis, EF 20% with diffuse hypokinesis, moderately decreased RV systolic function.   SH: Widower, retired, lives in [place].  Nonsmoker, occasional ETOH (not heavy).   FH: No history of premature CAD or cardiomyopathy.   ROS: All systems reviewed and negative except as per HPI.   Current Outpatient Medications  Medication Sig Dispense Refill  . Glucosamine 500 MG CAPS Take 1 capsule by mouth daily.    . Melatonin 5 MG TABS Take 1 tablet by mouth daily.    . Multiple Vitamin (MULTIVITAMIN WITH MINERALS) TABS tablet Take 1 tablet by mouth daily.    . Multiple Vitamins-Minerals (PRESERVISION AREDS PO) Take 1 tablet by mouth daily.    . simvastatin (ZOCOR) 20 MG  tablet Take 20 mg by mouth every morning.    . carvedilol (COREG) 3.125 MG tablet Take 1 tablet (3.125 mg total) by mouth 2 (two) times daily. 180 tablet 3  . furosemide (LASIX) 20 MG tablet Take 1 tablet (20 mg total) by mouth every other day. 15 tablet 5  . sacubitril-valsartan (ENTRESTO) 24-26 MG Take 1 tablet by mouth 2 (two) times daily. 60 tablet 5   No current facility-administered medications for this encounter.   BP 110/90   Pulse 97   Wt 70.3 kg (155 lb)   SpO2 97%   BMI 22.24 kg/m  General: NAD Neck: No JVD, no thyromegaly or thyroid nodule.  Lungs: Clear to auscultation bilaterally with normal respiratory effort. CV: Nondisplaced PMI.  Heart regular S1/S2, no S3/S4, no murmur.  No peripheral edema.  No carotid bruit.  Normal pedal pulses.  Abdomen: Soft, nontender, no hepatosplenomegaly, no distention.  Skin: Intact without lesions or rashes.  Neurologic: Alert and oriented x 3.  Psych: Normal affect. Extremities: No clubbing or cyanosis.  HEENT: Normal.   Assessment/Plan: 1. Chronic systolic CHF: Echo (A999333) with severe LVH and speckled pattern concerning for cardiac amyloidosis, EF 20%, moderately decreased RV systolic function.  Cause is uncertain.  Appearance of myocardium is suggestive of amyloidosis, but this usually is not associated with a profound fall in EF. He does have symptoms that may be consistent with peripheral neuropathy.  ECG could be consistent  with prior MI though no chest pain.  NYHA class II symptoms, not volume overloaded on exam.  - I think that he will need right/left heart cath to rule out coronary disease and assess filling pressures/cardiac output.  We discussed risks/benefits and he agreed to procedure.   - He will need workup for cardiac amyloidosis. I will send for PYP scan to assess for ATTR amyloidosis and myeloma panel/urine immunofixation. He may need a cardiac MRI as next step.  - Stop losartan, start Entresto 24/26 bid.  BMET 10 days.  -  Start Coreg 3.125 mg bid.  - Stop KCl 20. Continue Lasix 20 daily.  2. ?COPD: Prior smoker. Has seen pulmonary.  - To get PFTs.   Followup with me in 3-4 weeks after cath.   Loralie Champagne 09/13/2019

## 2019-09-13 NOTE — H&P (View-Only) (Signed)
PCP: Dr. Inda Merlin Cardiology: Dr. Aundra Dubin  85 yo was referred by Dr. Inda Merlin for evaluation of CHF.  I took care of his wife in the past.  He had been generally health up until this fall.  He developed worsening fatigue as well as exertional dyspnea.  His PCP started him on Lasix 20 mg daily, which helped his breathing.  Echo was done (cannot review as done at non-[facility] facility) showing severe LVH with a speckled pattern concerning for amyloidosis.  Also noted was EF of 20%, diffuse hypokinesis, and moderate RV dysfunction.    He is still exercising daily in his house but noting more fatigue.  He had orthopnea until he started Lasix, now it has resolved.  Weight has been trending down.  Poor appetite. He is short of breath after walking about 50 yards, this is also improved on Lasix. No chest pain.  No lightheadedness. He gets tingling in his feet at times. No history of carpal tunnel syndrome.   ECG (personally reviewed): NSR, PVCs, LVH, ?old ASMI.   Labs (12/20): K 52, creatinine 1.26, BNP 628, hgb 14.4  PMH: 1. HTN 2. Hyperlipidemia 3. Prostate cancer 4. Osteoarthritis 5. Macular degeneration.  6. Chronic systolic CHF:  - Echo (Q000111Q): EF 55-60%, normal RV.  - Echo (11/20): Severe LVH with speckled pattern concerning for amyloidosis, EF 20% with diffuse hypokinesis, moderately decreased RV systolic function.   SH: Widower, retired, lives in [place].  Nonsmoker, occasional ETOH (not heavy).   FH: No history of premature CAD or cardiomyopathy.   ROS: All systems reviewed and negative except as per HPI.   Current Outpatient Medications  Medication Sig Dispense Refill  . Glucosamine 500 MG CAPS Take 1 capsule by mouth daily.    . Melatonin 5 MG TABS Take 1 tablet by mouth daily.    . Multiple Vitamin (MULTIVITAMIN WITH MINERALS) TABS tablet Take 1 tablet by mouth daily.    . Multiple Vitamins-Minerals (PRESERVISION AREDS PO) Take 1 tablet by mouth daily.    . simvastatin (ZOCOR) 20 MG  tablet Take 20 mg by mouth every morning.    . carvedilol (COREG) 3.125 MG tablet Take 1 tablet (3.125 mg total) by mouth 2 (two) times daily. 180 tablet 3  . furosemide (LASIX) 20 MG tablet Take 1 tablet (20 mg total) by mouth every other day. 15 tablet 5  . sacubitril-valsartan (ENTRESTO) 24-26 MG Take 1 tablet by mouth 2 (two) times daily. 60 tablet 5   No current facility-administered medications for this encounter.   BP 110/90   Pulse 97   Wt 70.3 kg (155 lb)   SpO2 97%   BMI 22.24 kg/m  General: NAD Neck: No JVD, no thyromegaly or thyroid nodule.  Lungs: Clear to auscultation bilaterally with normal respiratory effort. CV: Nondisplaced PMI.  Heart regular S1/S2, no S3/S4, no murmur.  No peripheral edema.  No carotid bruit.  Normal pedal pulses.  Abdomen: Soft, nontender, no hepatosplenomegaly, no distention.  Skin: Intact without lesions or rashes.  Neurologic: Alert and oriented x 3.  Psych: Normal affect. Extremities: No clubbing or cyanosis.  HEENT: Normal.   Assessment/Plan: 1. Chronic systolic CHF: Echo (A999333) with severe LVH and speckled pattern concerning for cardiac amyloidosis, EF 20%, moderately decreased RV systolic function.  Cause is uncertain.  Appearance of myocardium is suggestive of amyloidosis, but this usually is not associated with a profound fall in EF. He does have symptoms that may be consistent with peripheral neuropathy.  ECG could be consistent  with prior MI though no chest pain.  NYHA class II symptoms, not volume overloaded on exam.  - I think that he will need right/left heart cath to rule out coronary disease and assess filling pressures/cardiac output.  We discussed risks/benefits and he agreed to procedure.   - He will need workup for cardiac amyloidosis. I will send for PYP scan to assess for ATTR amyloidosis and myeloma panel/urine immunofixation. He may need a cardiac MRI as next step.  - Stop losartan, start Entresto 24/26 bid.  BMET 10 days.  -  Start Coreg 3.125 mg bid.  - Stop KCl 20. Continue Lasix 20 daily.  2. ?COPD: Prior smoker. Has seen pulmonary.  - To get PFTs.   Followup with me in 3-4 weeks after cath.   Loralie Champagne 09/13/2019

## 2019-09-14 LAB — IMMUNOFIXATION, URINE

## 2019-09-14 NOTE — Telephone Encounter (Signed)
LMTCB x2 for pt 

## 2019-09-15 LAB — MULTIPLE MYELOMA PANEL, SERUM
Albumin SerPl Elph-Mcnc: 3.8 g/dL (ref 2.9–4.4)
Albumin/Glob SerPl: 1.3 (ref 0.7–1.7)
Alpha 1: 0.3 g/dL (ref 0.0–0.4)
Alpha2 Glob SerPl Elph-Mcnc: 1.1 g/dL — ABNORMAL HIGH (ref 0.4–1.0)
B-Globulin SerPl Elph-Mcnc: 1.1 g/dL (ref 0.7–1.3)
Gamma Glob SerPl Elph-Mcnc: 0.5 g/dL (ref 0.4–1.8)
Globulin, Total: 3 g/dL (ref 2.2–3.9)
IgA: 158 mg/dL (ref 61–437)
IgG (Immunoglobin G), Serum: 589 mg/dL — ABNORMAL LOW (ref 603–1613)
IgM (Immunoglobulin M), Srm: 18 mg/dL (ref 15–143)
Total Protein ELP: 6.8 g/dL (ref 6.0–8.5)

## 2019-09-15 NOTE — Telephone Encounter (Signed)
Spoke to pt & let him know that the PFT will require a COVID test.  PT thought that since he was having an earlier COVID test, he wouldn't need to complete 2.  I explained that the other COVID test is too far in advance of his PFT.  Pt will keep COVID Test & PFT as scheduled.

## 2019-09-21 ENCOUNTER — Other Ambulatory Visit: Payer: Self-pay

## 2019-09-21 ENCOUNTER — Ambulatory Visit (HOSPITAL_COMMUNITY)
Admission: RE | Admit: 2019-09-21 | Discharge: 2019-09-21 | Disposition: A | Discharge status: Home / Self Care | Payer: Medicare Other | Source: Ambulatory Visit | Attending: Internal Medicine | Admitting: Internal Medicine

## 2019-09-21 DIAGNOSIS — I5022 Chronic systolic (congestive) heart failure: Secondary | ICD-10-CM | POA: Diagnosis present

## 2019-09-21 LAB — BASIC METABOLIC PANEL
Anion gap: 11 (ref 5–15)
BUN: 32 mg/dL — ABNORMAL HIGH (ref 8–23)
CO2: 26 mmol/L (ref 22–32)
Calcium: 9.8 mg/dL (ref 8.9–10.3)
Chloride: 104 mmol/L (ref 98–111)
Creatinine, Ser: 1.32 mg/dL — ABNORMAL HIGH (ref 0.61–1.24)
GFR calc Af Amer: 55 mL/min — ABNORMAL LOW (ref >60)
GFR calc non Af Amer: 48 mL/min — ABNORMAL LOW (ref >60)
Glucose, Bld: 98 mg/dL (ref 70–99)
Potassium: 4.9 mmol/L (ref 3.5–5.1)
Sodium: 141 mmol/L (ref 135–145)

## 2019-09-26 ENCOUNTER — Other Ambulatory Visit (HOSPITAL_COMMUNITY)
Admission: RE | Admit: 2019-09-26 | Discharge: 2019-09-26 | Disposition: A | Discharge status: Home / Self Care | Payer: Medicare Other | Source: Ambulatory Visit | Attending: Cardiology | Admitting: Cardiology

## 2019-09-26 ENCOUNTER — Telehealth: Payer: Self-pay

## 2019-09-26 DIAGNOSIS — Z01812 Encounter for preprocedural laboratory examination: Secondary | ICD-10-CM | POA: Diagnosis present

## 2019-09-26 DIAGNOSIS — Z20828 Contact with and (suspected) exposure to other viral communicable diseases: Secondary | ICD-10-CM | POA: Diagnosis not present

## 2019-09-26 LAB — SARS CORONAVIRUS 2 (TAT 6-24 HRS): SARS Coronavirus 2: NEGATIVE

## 2019-09-26 NOTE — Telephone Encounter (Signed)
Patient's daughter, his contact,  was called and these instructions were reviewed with her. The daughter had no questions or concerns at this time.    Pt contacted pre-catheterization scheduled at [facility] for: 10:00 am  Verified arrival time and place: [facility]) at: 8:00 am   No solid food after midnight prior to cath, clear liquids until 5 AM day of procedure. Contrast allergy: No Verified no diabetes medications. None  AM meds can be  taken pre-cath with sip of water including: ASA 81 mg  Patient Instructed to hold his Lasix the morning of the procedure.  Confirmed patient has responsible adult to drive home post procedure and observe 24 hours after arriving home: Patient has a responsible person to drive him home after the procedure and to observe him for 24 hours after the procedure.  Currently, due to Covid-19 pandemic, only one support person will be allowed with patient. Must be the same support person for that patient's entire stay, will be screened and required to wear a mask. They will be asked to wait in the waiting room for the duration of the patient's stay.  Patients are required to wear a mask when they enter the hospital.      COVID-19 Pre-Screening Questions:  . In the past 7 to 10 days have you had a cough,  shortness of breath, headache, congestion, fever (100 or greater) body aches, chills, sore throat, or sudden loss of taste or sense of smell? No . Have you been around anyone with known Covid 19. No . Have you been around anyone who is awaiting Covid 19 test results in the past 7 to 10 days? No . Have you been around anyone who has been exposed to Covid 19, or has mentioned symptoms of Covid 19 within the past 7 to 10 days? No  If you have any concerns/questions about symptoms patients report during screening (either on the phone or at threshold). Contact the provider seeing the patient or DOD for further guidance.  If  neither are available contact a member of the leadership team.

## 2019-09-28 ENCOUNTER — Other Ambulatory Visit: Payer: Self-pay

## 2019-09-28 ENCOUNTER — Ambulatory Visit (HOSPITAL_COMMUNITY)
Admission: RE | Admit: 2019-09-28 | Discharge: 2019-09-28 | Disposition: A | Discharge status: Home / Self Care | Payer: Medicare Other | Attending: Cardiology | Admitting: Cardiology

## 2019-09-28 ENCOUNTER — Encounter (HOSPITAL_COMMUNITY)
Admission: RE | Disposition: A | Discharge status: Home / Self Care | Payer: Medicare Other | Source: Home / Self Care | Attending: Cardiology

## 2019-09-28 DIAGNOSIS — E785 Hyperlipidemia, unspecified: Secondary | ICD-10-CM | POA: Insufficient documentation

## 2019-09-28 DIAGNOSIS — I509 Heart failure, unspecified: Secondary | ICD-10-CM

## 2019-09-28 DIAGNOSIS — M1990 Unspecified osteoarthritis, unspecified site: Secondary | ICD-10-CM | POA: Diagnosis not present

## 2019-09-28 DIAGNOSIS — Z79899 Other long term (current) drug therapy: Secondary | ICD-10-CM | POA: Insufficient documentation

## 2019-09-28 DIAGNOSIS — E859 Amyloidosis, unspecified: Secondary | ICD-10-CM

## 2019-09-28 DIAGNOSIS — Z8546 Personal history of malignant neoplasm of prostate: Secondary | ICD-10-CM | POA: Insufficient documentation

## 2019-09-28 DIAGNOSIS — I110 Hypertensive heart disease with heart failure: Secondary | ICD-10-CM | POA: Insufficient documentation

## 2019-09-28 DIAGNOSIS — I428 Other cardiomyopathies: Secondary | ICD-10-CM | POA: Diagnosis not present

## 2019-09-28 DIAGNOSIS — I5022 Chronic systolic (congestive) heart failure: Secondary | ICD-10-CM | POA: Diagnosis not present

## 2019-09-28 DIAGNOSIS — Z87891 Personal history of nicotine dependence: Secondary | ICD-10-CM | POA: Insufficient documentation

## 2019-09-28 HISTORY — PX: RIGHT/LEFT HEART CATH AND CORONARY ANGIOGRAPHY: CATH118266

## 2019-09-28 LAB — POCT I-STAT EG7
Acid-Base Excess: 2 mmol/L (ref 0.0–2.0)
Acid-Base Excess: 2 mmol/L (ref 0.0–2.0)
Bicarbonate: 27 mmol/L (ref 20.0–28.0)
Bicarbonate: 27.7 mmol/L (ref 20.0–28.0)
Calcium, Ion: 1.2 mmol/L (ref 1.15–1.40)
Calcium, Ion: 1.26 mmol/L (ref 1.15–1.40)
HCT: 44 % (ref 39.0–52.0)
HCT: 44 % (ref 39.0–52.0)
Hemoglobin: 15 g/dL (ref 13.0–17.0)
Hemoglobin: 15 g/dL (ref 13.0–17.0)
O2 Saturation: 70 %
O2 Saturation: 71 %
Potassium: 4 mmol/L (ref 3.5–5.1)
Potassium: 4.1 mmol/L (ref 3.5–5.1)
Sodium: 143 mmol/L (ref 135–145)
Sodium: 145 mmol/L (ref 135–145)
TCO2: 28 mmol/L (ref 22–32)
TCO2: 29 mmol/L (ref 22–32)
pCO2, Ven: 44.3 mmHg (ref 44.0–60.0)
pCO2, Ven: 45.7 mmHg (ref 44.0–60.0)
pH, Ven: 7.39 (ref 7.250–7.430)
pH, Ven: 7.393 (ref 7.250–7.430)
pO2, Ven: 37 mmHg (ref 32.0–45.0)
pO2, Ven: 38 mmHg (ref 32.0–45.0)

## 2019-09-28 SURGERY — RIGHT/LEFT HEART CATH AND CORONARY ANGIOGRAPHY
Anesthesia: LOCAL

## 2019-09-28 MED ORDER — ASPIRIN 81 MG PO CHEW: 81.0000 mg | CHEWABLE_TABLET | ORAL | Status: DC

## 2019-09-28 MED ORDER — LIDOCAINE HCL (PF) 1 % IJ SOLN
INTRAMUSCULAR | Status: DC | PRN
  Administered 2019-09-28 (×2): 2 mL via INTRADERMAL

## 2019-09-28 MED ORDER — MIDAZOLAM HCL 2 MG/2ML IJ SOLN
INTRAMUSCULAR | Status: AC
  Filled 2019-09-28: qty 2

## 2019-09-28 MED ORDER — SODIUM CHLORIDE 0.9 % IV SOLN: 250.0000 mL | INTRAVENOUS | Status: DC | PRN

## 2019-09-28 MED ORDER — HEPARIN (PORCINE) IN NACL 1000-0.9 UT/500ML-% IV SOLN
INTRAVENOUS | Status: DC | PRN
  Administered 2019-09-28 (×2): 500 mL

## 2019-09-28 MED ORDER — SODIUM CHLORIDE 0.9 % IV SOLN
INTRAVENOUS | Status: AC | PRN
  Administered 2019-09-28: 10 mL/h via INTRAVENOUS

## 2019-09-28 MED ORDER — SODIUM CHLORIDE 0.9% FLUSH: 3.0000 mL | INTRAVENOUS | Status: DC | PRN

## 2019-09-28 MED ORDER — MIDAZOLAM HCL 2 MG/2ML IJ SOLN
INTRAMUSCULAR | Status: DC | PRN
  Administered 2019-09-28: 1 mg via INTRAVENOUS

## 2019-09-28 MED ORDER — VERAPAMIL HCL 2.5 MG/ML IV SOLN
INTRAVENOUS | Status: AC
  Filled 2019-09-28: qty 2

## 2019-09-28 MED ORDER — ONDANSETRON HCL 4 MG/2ML IJ SOLN: 4.0000 mg | Freq: Four times a day (QID) | INTRAMUSCULAR | Status: DC | PRN

## 2019-09-28 MED ORDER — FENTANYL CITRATE (PF) 100 MCG/2ML IJ SOLN
INTRAMUSCULAR | Status: DC | PRN
  Administered 2019-09-28: 25 ug via INTRAVENOUS

## 2019-09-28 MED ORDER — ACETAMINOPHEN 325 MG PO TABS: 650.0000 mg | ORAL_TABLET | ORAL | Status: DC | PRN

## 2019-09-28 MED ORDER — IOHEXOL 350 MG/ML SOLN
INTRAVENOUS | Status: DC | PRN
  Administered 2019-09-28: 65 mL via INTRA_ARTERIAL

## 2019-09-28 MED ORDER — VERAPAMIL HCL 2.5 MG/ML IV SOLN
INTRAVENOUS | Status: DC | PRN
  Administered 2019-09-28: 10 mL via INTRA_ARTERIAL

## 2019-09-28 MED ORDER — HYDRALAZINE HCL 20 MG/ML IJ SOLN: 10.0000 mg | INTRAMUSCULAR | Status: DC | PRN

## 2019-09-28 MED ORDER — LIDOCAINE HCL (PF) 1 % IJ SOLN
INTRAMUSCULAR | Status: AC
  Filled 2019-09-28: qty 30

## 2019-09-28 MED ORDER — SODIUM CHLORIDE 0.9 % IV SOLN: INTRAVENOUS | Status: DC

## 2019-09-28 MED ORDER — FENTANYL CITRATE (PF) 100 MCG/2ML IJ SOLN
INTRAMUSCULAR | Status: AC
  Filled 2019-09-28: qty 2

## 2019-09-28 MED ORDER — HEPARIN SODIUM (PORCINE) 1000 UNIT/ML IJ SOLN
INTRAMUSCULAR | Status: DC | PRN
  Administered 2019-09-28: 3500 [IU] via INTRAVENOUS

## 2019-09-28 MED ORDER — LABETALOL HCL 5 MG/ML IV SOLN: 10.0000 mg | INTRAVENOUS | Status: DC | PRN

## 2019-09-28 MED ORDER — SODIUM CHLORIDE 0.9% FLUSH: 3.0000 mL | Freq: Two times a day (BID) | INTRAVENOUS | Status: DC

## 2019-09-28 MED ORDER — HEPARIN (PORCINE) IN NACL 1000-0.9 UT/500ML-% IV SOLN
INTRAVENOUS | Status: AC
  Filled 2019-09-28: qty 1000

## 2019-09-28 MED ORDER — HEPARIN SODIUM (PORCINE) 1000 UNIT/ML IJ SOLN
INTRAMUSCULAR | Status: AC
  Filled 2019-09-28: qty 1

## 2019-09-28 SURGICAL SUPPLY — 11 items

## 2019-09-28 NOTE — Progress Notes (Signed)
Discharge instructions reviewed with patient and family. Verbalized understanding. 

## 2019-09-28 NOTE — Discharge Instructions (Signed)
Radial Site Care ° °This sheet gives you information about how to care for yourself after your procedure. Your health care provider may also give you more specific instructions. If you have problems or questions, contact your health care provider. °What can I expect after the procedure? °After the procedure, it is common to have: °· Bruising and tenderness at the catheter insertion area. °Follow these instructions at home: °Medicines °· Take over-the-counter and prescription medicines only as told by your health care provider. °Insertion site care °· Follow instructions from your health care provider about how to take care of your insertion site. Make sure you: °? Wash your hands with soap and water before you change your bandage (dressing). If soap and water are not available, use hand sanitizer. °? Change your dressing as told by your health care provider. °? Leave stitches (sutures), skin glue, or adhesive strips in place. These skin closures may need to stay in place for 2 weeks or longer. If adhesive strip edges start to loosen and curl up, you may trim the loose edges. Do not remove adhesive strips completely unless your health care provider tells you to do that. °· Check your insertion site every day for signs of infection. Check for: °? Redness, swelling, or pain. °? Fluid or blood. °? Pus or a bad smell. °? Warmth. °· Do not take baths, swim, or use a hot tub until your health care provider approves. °· You may shower 24-48 hours after the procedure, or as directed by your health care provider. °? Remove the dressing and gently wash the site with plain soap and water. °? Pat the area dry with a clean towel. °? Do not rub the site. That could cause bleeding. °· Do not apply powder or lotion to the site. °Activity ° °· For 24 hours after the procedure, or as directed by your health care provider: °? Do not flex or bend the affected arm. °? Do not push or pull heavy objects with the affected arm. °? Do not  drive yourself home from the hospital or clinic. You may drive 24 hours after the procedure unless your health care provider tells you not to. °? Do not operate machinery or power tools. °· Do not lift anything that is heavier than 10 lb (4.5 kg), or the limit that you are told, until your health care provider says that it is safe. °· Ask your health care provider when it is okay to: °? Return to work or school. °? Resume usual physical activities or sports. °? Resume sexual activity. °General instructions °· If the catheter site starts to bleed, raise your arm and put firm pressure on the site. If the bleeding does not stop, get help right away. This is a medical emergency. °· If you went home on the same day as your procedure, a responsible adult should be with you for the first 24 hours after you arrive home. °· Keep all follow-up visits as told by your health care provider. This is important. °Contact a health care provider if: °· You have a fever. °· You have redness, swelling, or yellow drainage around your insertion site. °Get help right away if: °· You have unusual pain at the radial site. °· The catheter insertion area swells very fast. °· The insertion area is bleeding, and the bleeding does not stop when you hold steady pressure on the area. °· Your arm or hand becomes pale, cool, tingly, or numb. °These symptoms may represent a serious problem   that is an emergency. Do not wait to see if the symptoms will go away. Get medical help right away. Call your local emergency services (911 in the [place] Do not drive yourself to the hospital. °Summary °· After the procedure, it is common to have bruising and tenderness at the site. °· Follow instructions from your health care provider about how to take care of your radial site wound. Check the wound every day for signs of infection. °· Do not lift anything that is heavier than 10 lb (4.5 kg), or the limit that you are told, until your health care provider says  that it is safe. °This information is not intended to replace advice given to you by your health care provider. Make sure you discuss any questions you have with your health care provider. °Document Released: 10/18/2010 Document Revised: 10/21/2017 Document Reviewed: 10/21/2017 °Elsevier Patient Education © 2020 [employer] ° °

## 2019-09-28 NOTE — Interval H&P Note (Signed)
History and Physical Interval Note:  09/28/2019 10:17 AM  Corey Harrison  has presented today for surgery, with the diagnosis of heart failure.  The various methods of treatment have been discussed with the patient and family. After consideration of risks, benefits and other options for treatment, the patient has consented to  Procedure(s): RIGHT/LEFT HEART CATH AND CORONARY ANGIOGRAPHY (N/A) as a surgical intervention.  The patient's history has been reviewed, patient examined, no change in status, stable for surgery.  I have reviewed the patient's chart and labs.  Questions were answered to the patient's satisfaction.     Corey Harrison

## 2019-10-03 ENCOUNTER — Encounter (HOSPITAL_COMMUNITY)
Admission: RE | Admit: 2019-10-03 | Discharge: 2019-10-03 | Disposition: A | Discharge status: Home / Self Care | Payer: Medicare Other | Source: Ambulatory Visit | Attending: Cardiology | Admitting: Cardiology

## 2019-10-03 ENCOUNTER — Ambulatory Visit (HOSPITAL_COMMUNITY)
Admission: RE | Admit: 2019-10-03 | Discharge: 2019-10-03 | Disposition: A | Discharge status: Home / Self Care | Payer: Medicare Other | Source: Ambulatory Visit | Attending: Cardiology | Admitting: Cardiology

## 2019-10-03 ENCOUNTER — Other Ambulatory Visit: Payer: Self-pay

## 2019-10-03 DIAGNOSIS — E859 Amyloidosis, unspecified: Secondary | ICD-10-CM | POA: Diagnosis not present

## 2019-10-03 DIAGNOSIS — I509 Heart failure, unspecified: Secondary | ICD-10-CM | POA: Diagnosis not present

## 2019-10-03 MED ORDER — TECHNETIUM TC 99M PYROPHOSPHATE
20.2000 | Freq: Once | INTRAVENOUS | Status: AC | PRN
  Administered 2019-10-03: 20.2 via INTRAVENOUS
  Filled 2019-10-03: qty 21

## 2019-10-04 ENCOUNTER — Ambulatory Visit (HOSPITAL_COMMUNITY)
Admission: RE | Admit: 2019-10-04 | Discharge: 2019-10-04 | Disposition: A | Discharge status: Home / Self Care | Payer: Medicare Other | Source: Ambulatory Visit | Attending: Cardiology | Admitting: Cardiology

## 2019-10-04 VITALS — BP 139/84 | HR 67 | Wt 155.6 lb

## 2019-10-04 DIAGNOSIS — I43 Cardiomyopathy in diseases classified elsewhere: Secondary | ICD-10-CM | POA: Diagnosis not present

## 2019-10-04 DIAGNOSIS — H3530 Unspecified macular degeneration: Secondary | ICD-10-CM | POA: Insufficient documentation

## 2019-10-04 DIAGNOSIS — E8582 Wild-type transthyretin-related (ATTR) amyloidosis: Secondary | ICD-10-CM | POA: Diagnosis not present

## 2019-10-04 DIAGNOSIS — E859 Amyloidosis, unspecified: Secondary | ICD-10-CM | POA: Diagnosis not present

## 2019-10-04 DIAGNOSIS — I428 Other cardiomyopathies: Secondary | ICD-10-CM | POA: Diagnosis not present

## 2019-10-04 DIAGNOSIS — I5022 Chronic systolic (congestive) heart failure: Secondary | ICD-10-CM | POA: Diagnosis not present

## 2019-10-04 DIAGNOSIS — M1990 Unspecified osteoarthritis, unspecified site: Secondary | ICD-10-CM | POA: Diagnosis not present

## 2019-10-04 DIAGNOSIS — E854 Organ-limited amyloidosis: Secondary | ICD-10-CM | POA: Diagnosis not present

## 2019-10-04 DIAGNOSIS — Z79899 Other long term (current) drug therapy: Secondary | ICD-10-CM | POA: Insufficient documentation

## 2019-10-04 DIAGNOSIS — Z87891 Personal history of nicotine dependence: Secondary | ICD-10-CM | POA: Diagnosis not present

## 2019-10-04 DIAGNOSIS — Z8546 Personal history of malignant neoplasm of prostate: Secondary | ICD-10-CM | POA: Diagnosis not present

## 2019-10-04 DIAGNOSIS — I110 Hypertensive heart disease with heart failure: Secondary | ICD-10-CM | POA: Diagnosis not present

## 2019-10-04 DIAGNOSIS — E785 Hyperlipidemia, unspecified: Secondary | ICD-10-CM | POA: Insufficient documentation

## 2019-10-04 LAB — BASIC METABOLIC PANEL
Anion gap: 11 (ref 5–15)
BUN: 35 mg/dL — ABNORMAL HIGH (ref 8–23)
CO2: 26 mmol/L (ref 22–32)
Calcium: 9.2 mg/dL (ref 8.9–10.3)
Chloride: 104 mmol/L (ref 98–111)
Creatinine, Ser: 1.31 mg/dL — ABNORMAL HIGH (ref 0.61–1.24)
GFR calc Af Amer: 56 mL/min — ABNORMAL LOW (ref >60)
GFR calc non Af Amer: 48 mL/min — ABNORMAL LOW (ref >60)
Glucose, Bld: 97 mg/dL (ref 70–99)
Potassium: 4.3 mmol/L (ref 3.5–5.1)
Sodium: 141 mmol/L (ref 135–145)

## 2019-10-04 MED ORDER — SACUBITRIL-VALSARTAN 49-51 MG PO TABS: 1.0000 | ORAL_TABLET | Freq: Two times a day (BID) | ORAL | 5 refills | Status: DC

## 2019-10-04 MED ORDER — TAFAMIDIS 61 MG PO CAPS: 61.0000 mg | ORAL_CAPSULE | Freq: Every day | ORAL | 11 refills | Status: DC

## 2019-10-04 MED ORDER — FUROSEMIDE 20 MG PO TABS: ORAL_TABLET | ORAL | 5 refills | Status: DC

## 2019-10-04 NOTE — Patient Instructions (Addendum)
INCREASE Entresto to 49/51mg  (1 tab) twice a day  START Tafamidis 61mg  (1 capsule) daily when available from [place] Lasix: Take Lasix  20mg  Every Third Day.  Labs today and repeat in 10 days We will only contact you if something comes back abnormal or we need to make some changes. Otherwise no news is good news!   You have been referred to Dr Irene Limbo at Hematology. They will call you to make any appointment.    Genetic test has been done, this has to be sent to [place] to be processed and can take 1-2 weeks to get results back.  We will let you know the results.  Your physician recommends that you schedule a follow-up appointment in: 3 weeks with Dr Aundra Dubin  At the [facility], you and your health needs are our priority. As part of our continuing mission to provide you with exceptional heart care, we have created designated Provider Care Teams. These Care Teams include your primary Cardiologist (physician) and Advanced Practice Providers (APPs- Physician Assistants and Nurse Practitioners) who all work together to provide you with the care you need, when you need it.   You may see any of the following providers on your designated Care Team at your next follow up: Marland Kitchen Dr Glori Bickers . Dr Loralie Champagne . Darrick Grinder, NP . Lyda Jester, PA . Audry Riles, PharmD   Please be sure to bring in all your medications bottles to every appointment.

## 2019-10-04 NOTE — Progress Notes (Signed)
Blood collected for TTR genetic testing per Dr Aundra Dubin.  Order form completed and both shipped by [employer] to [facility].  DNR form completed and signed by MD as requested by patient.

## 2019-10-05 ENCOUNTER — Telehealth (HOSPITAL_COMMUNITY): Payer: Self-pay

## 2019-10-05 NOTE — Progress Notes (Signed)
PCP: Dr. Inda Merlin Cardiology: Dr. Aundra Dubin  86 yo was referred by Dr. Inda Merlin for evaluation of CHF.  I took care of his wife in the past.  He had been generally health up until this fall.  He developed worsening fatigue as well as exertional dyspnea.  His PCP started him on Lasix 20 mg daily, which helped his breathing.  Echo was done (cannot review as done at non-[facility] facility) showing severe LVH with a speckled pattern concerning for amyloidosis.  Also noted was EF of 20%, diffuse hypokinesis, and moderate RV dysfunction.  LHC/RHC was done in 12/20, showing no significant CAD and optimized filling pressures with low cardiac output.    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    He returns for followup of CHF.  Symptomatically, he is stable.  Weight is stable. Mild dyspnea with stairs.  He walks for 30 minutes a day, mildly short of breath at the end of his walk.  He does not have the stamina that he used to have.  No orthopnea/PND.  Occasional mild lightheadedness.    ECG (personally reviewed): NSR, PVCs, LVH, ?old ASMI.   Labs (12/20): K 5.2 => 4.9, creatinine 1.26 => 1.32, BNP 628, hgb 14.4.  Serum immunofixation with IgA monoclonal protein (light chain).  Urine immunofixation negative.   PMH: 1. HTN 2. Hyperlipidemia 3. Prostate cancer 4. Osteoarthritis 5. Macular degeneration.  6. Chronic systolic CHF:  - Echo (Q000111Q): EF 55-60%, normal RV.  - Echo (11/20): Severe LVH with speckled pattern concerning for amyloidosis, EF 20% with diffuse hypokinesis, moderately decreased RV systolic function.  - LHC/RHC (12/20): No significant CAD.  Mean RA 2, PA 36/15, mean PCWP 14, CI 2.06.  - PYP scan: grade 3, H/CL ratio 2.8. Strongly suggestive of transthyretin amyloidosis.   SH: Widower, retired, lives in [place].  Nonsmoker, occasional ETOH (not heavy).   FH: No history of premature CAD or cardiomyopathy.   ROS:  All systems reviewed and negative except as per HPI.   Current Outpatient Medications  Medication Sig Dispense Refill  . carvedilol (COREG) 3.125 MG tablet Take 1 tablet (3.125 mg total) by mouth 2 (two) times daily. 180 tablet 3  . furosemide (LASIX) 20 MG tablet Take 1 tab every third day 15 tablet 5  . Glucosamine 500 MG CAPS Take 500 mg by mouth daily.     . Melatonin 5 MG TABS Take 5 mg by mouth daily.     . Multiple Vitamin (MULTIVITAMIN WITH MINERALS) TABS tablet Take 1 tablet by mouth daily.    . Multiple Vitamins-Minerals (PRESERVISION AREDS PO) Take 1 tablet by mouth daily.    . simvastatin (ZOCOR) 20 MG tablet Take 20 mg by mouth every morning.    . sacubitril-valsartan (ENTRESTO) 49-51 MG Take 1 tablet by mouth 2 (two) times daily. 60 tablet 5  . Tafamidis 61 MG CAPS Take 61 mg by mouth daily. 30 capsule 11   No current facility-administered medications for this encounter.   BP 139/84   Pulse 67   Wt 70.6 kg (155 lb 9.6 oz)   SpO2 99%   BMI 22.98 kg/m  General: NAD Neck: No JVD, no thyromegaly or thyroid nodule.  Lungs: Clear to auscultation bilaterally with normal respiratory effort. CV: Nondisplaced PMI.  Heart regular S1/S2, no S3/S4, no murmur.  No peripheral edema.  No carotid bruit.  Normal pedal pulses.  Abdomen: Soft, nontender, no hepatosplenomegaly, no distention.  Skin:  Intact without lesions or rashes.  Neurologic: Alert and oriented x 3.  Psych: Normal affect. Extremities: No clubbing or cyanosis.  HEENT: Normal.   Assessment/Plan: 1. Chronic systolic CHF: Nonischemic cardiomyopathy. Echo (11/20) with severe LVH and speckled pattern concerning for cardiac amyloidosis, EF 20%, moderately decreased RV systolic function.  LHC/RHC in 12/20 showed no significant CAD, filling pressures not elevated but cardiac output moderately depressed.  Appearance of myocardium is suggestive of amyloidosis, but this usually is not associated with a profound fall in EF. He does  have symptoms that may be consistent with peripheral neuropathy.  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  NYHA class II symptoms, not volume overloaded on exam.  - Increase Entresto to 49/51 bid, BMET today and again in 10 days.  - Continue Coreg 3.125 mg bid.  - K is upper normal, will not start spironolactone yet (hopefully next appt).   - With increase in Entresto and near-normal filling pressures at cath, I will decrease Lasix to 20 mg every 3rd day.   2. ?COPD: Prior smoker. Has seen pulmonary.  - To get PFTs.  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  Has some symptoms of peripheral neuropathy.  Myocardial appearance on echo is suggestive of amyloidosis.  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  However, serum immunofixation did show a monoclonal IgA light chain.  I suspect that this is TTR amyloidosis given the strong positivity of the PYP scan, but would like hematology evaluation given the monoclonal light chain.  - I will refer for hematology evaluation.  - Send genetic testing for TTR amyloidosis.  - I am going to see if we can start him on tafamidis for TTR cardiac amyloidosis.   Followup with me in 3 wks.   Loralie Champagne 10/05/2019

## 2019-10-05 NOTE — Telephone Encounter (Signed)
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

## 2019-10-05 NOTE — Telephone Encounter (Signed)
LM for patient to return call. Office needs financial information to complete v[employer]dam[employer] application.

## 2019-10-06 ENCOUNTER — Telehealth: Payer: Self-pay | Admitting: Hematology

## 2019-10-06 NOTE — Telephone Encounter (Signed)
A new patient appt has been scheduled for Corey Harrison to see Dr. Irene Limbo on 1/13 at 10am. Corey Harrison is aware to arrive 15 minutes early.

## 2019-10-10 ENCOUNTER — Other Ambulatory Visit (HOSPITAL_COMMUNITY): Payer: Medicare Other

## 2019-10-12 ENCOUNTER — Encounter: Payer: Self-pay | Admitting: Hematology

## 2019-10-12 ENCOUNTER — Inpatient Hospital Stay: Payer: Medicare Other

## 2019-10-12 ENCOUNTER — Inpatient Hospital Stay: Payer: Medicare Other | Attending: Hematology | Admitting: Hematology

## 2019-10-12 ENCOUNTER — Other Ambulatory Visit: Payer: Self-pay

## 2019-10-12 VITALS — BP 139/73 | HR 81 | Temp 97.8°F | Resp 18 | Ht 69.0 in | Wt 154.9 lb

## 2019-10-12 DIAGNOSIS — Z8546 Personal history of malignant neoplasm of prostate: Secondary | ICD-10-CM | POA: Insufficient documentation

## 2019-10-12 DIAGNOSIS — D472 Monoclonal gammopathy: Secondary | ICD-10-CM | POA: Diagnosis not present

## 2019-10-12 DIAGNOSIS — Z87891 Personal history of nicotine dependence: Secondary | ICD-10-CM | POA: Diagnosis not present

## 2019-10-12 DIAGNOSIS — I10 Essential (primary) hypertension: Secondary | ICD-10-CM | POA: Insufficient documentation

## 2019-10-12 DIAGNOSIS — E859 Amyloidosis, unspecified: Secondary | ICD-10-CM

## 2019-10-12 LAB — CBC WITH DIFFERENTIAL/PLATELET
Abs Immature Granulocytes: 0.03 10*3/uL (ref 0.00–0.07)
Basophils Absolute: 0.1 10*3/uL (ref 0.0–0.1)
Basophils Relative: 1 %
Eosinophils Absolute: 0.2 10*3/uL (ref 0.0–0.5)
Eosinophils Relative: 3 %
HCT: 46.1 % (ref 39.0–52.0)
Hemoglobin: 15.6 g/dL (ref 13.0–17.0)
Immature Granulocytes: 0 %
Lymphocytes Relative: 17 %
Lymphs Abs: 1.5 10*3/uL (ref 0.7–4.0)
MCH: 33.3 pg (ref 26.0–34.0)
MCHC: 33.8 g/dL (ref 30.0–36.0)
MCV: 98.5 fL (ref 80.0–100.0)
Monocytes Absolute: 1 10*3/uL (ref 0.1–1.0)
Monocytes Relative: 11 %
Neutro Abs: 6 10*3/uL (ref 1.7–7.7)
Neutrophils Relative %: 68 %
Platelets: 233 10*3/uL (ref 150–400)
RBC: 4.68 MIL/uL (ref 4.22–5.81)
RDW: 15.8 % — ABNORMAL HIGH (ref 11.5–15.5)
WBC: 8.8 10*3/uL (ref 4.0–10.5)
nRBC: 0 % (ref 0.0–0.2)

## 2019-10-12 LAB — CMP (CANCER CENTER ONLY)
ALT: 21 U/L (ref 0–44)
AST: 32 U/L (ref 15–41)
Albumin: 4.5 g/dL (ref 3.5–5.0)
Alkaline Phosphatase: 69 U/L (ref 38–126)
Anion gap: 13 (ref 5–15)
BUN: 34 mg/dL — ABNORMAL HIGH (ref 8–23)
CO2: 25 mmol/L (ref 22–32)
Calcium: 9.9 mg/dL (ref 8.9–10.3)
Chloride: 105 mmol/L (ref 98–111)
Creatinine: 1.34 mg/dL — ABNORMAL HIGH (ref 0.61–1.24)
GFR, Est AFR Am: 54 mL/min — ABNORMAL LOW (ref >60)
GFR, Estimated: 47 mL/min — ABNORMAL LOW (ref >60)
Glucose, Bld: 98 mg/dL (ref 70–99)
Potassium: 4.7 mmol/L (ref 3.5–5.1)
Sodium: 143 mmol/L (ref 135–145)
Total Bilirubin: 0.8 mg/dL (ref 0.3–1.2)
Total Protein: 7.3 g/dL (ref 6.5–8.1)

## 2019-10-12 NOTE — Progress Notes (Signed)
HEMATOLOGY/ONCOLOGY CONSULTATION NOTE  Date of Service: 10/12/2019  Patient Care Team: Josetta Huddle, MD as PCP - General (Internal Medicine)  CHIEF COMPLAINTS/PURPOSE OF CONSULTATION:  Concern for Amyloidosis  HISTORY OF PRESENTING ILLNESS:   Corey Harrison is a wonderful 86 y.o. male who has been referred to [place] by Dr Aundra Dubin for evaluation and management of concern for cardiac amyloidosis. The pt reports that he is doing well overall.  The pt reports that about 3-4 months ago he began to notice that he was more weak, tired and was having some difficulty breathing and lightheadedness. Before this occurred pt was engaging an age appropriate work out regimen and has continued to remain active since his youth.  Pt has HTN and has a Hx of Prostate Cancer. Pt used to smoke but quit over 20 years ago. He currently drinks 3-4 oz of Vodka per day and used to drink a high volume of beer 30-40 years ago while in the [employer]. Pt has been on Losartan and Simvastatin for many years. Pt denies any previous diagnosis of RA or any known liver issues. Pt has been deeply affected by the passing of his wife, who he lost two years ago.   Pt has an upcoming appointment with Dr. Aundra Dubin in the next few weeks. They have not had a chance to discuss the 10/03/2019 Cardiac TPP scan as of yet. His symptoms have improved since he was seen by Dr. Aundra Dubin the first time. He is concerned about the inheritance of his amyloidosis, as he has 3 children.    Of note since the patient's last visit, pt has had NM Cardiac Amyloid Tumor ([id number]) completed on 10/03/2019 with results revealing "Visual and quantitative assessment (grade 3, H/CLL equal 2.8) are strongly suggestive of transthyretin amyloidosis."  Most recent lab results (09/12/2019) of CBC is as follows: all values are WNL. 10/04/2019 BMP is as follows: all values are WNL except for BUN at 35, Creatinine at 1.31, GFR Est Non Af Am at 48. 09/12/2019 MMP is as  follows:  all values are WNL except for IgG at 589, Alpha2 Glob at 1.1, M Protein spike found, but unable to quantify results, IFE 1 shows "IgA monoclonal protein with lambda light chain specificity."  On review of systems, pt denies abdominal pain, leg swelling, new bone pain, back pain and any other symptoms.   On PMHx the pt reports HTN, Prostate Cancer. On Social Hx the pt reports that he is a previous smoker (quit 20+ years ago), he currently drinks 3-4 oz of hard liquor daily - used to drink higher volume of beer 30-40 years ago   MEDICAL HISTORY:  Past Medical History:  Diagnosis Date  . Cancer [facility]) 1996   prostate  . Hypertension     SURGICAL HISTORY: Past Surgical History:  Procedure Laterality Date  . CARPAL TUNNEL RELEASE Right dec 2005  . CARPAL TUNNEL RELEASE Left jan 2006  . COLONOSCOPY WITH PROPOFOL N/A 12/20/2013   Procedure: COLONOSCOPY WITH PROPOFOL;  Surgeon: Garlan Fair, MD;  Location: [facility];  Service: Endoscopy;  Laterality: N/A;  . endoscopic sinus surgery  sept 1993  . EYE SURGERY  06-2012   right eye cataract  . HERNIA REPAIR  oct 1996   diaphragmatic  . HERNIA REPAIR Right jan 2003  . left hernia repair  dec 2007  . prostate surgery for cancer  02-1998  . rectal fistula repair  1974  . RIGHT/LEFT HEART CATH AND CORONARY ANGIOGRAPHY N/A  09/28/2019   Procedure: RIGHT/LEFT HEART CATH AND CORONARY ANGIOGRAPHY;  Surgeon: Larey Dresser, MD;  Location: [facility];  Service: Cardiovascular;  Laterality: N/A;  . ROTATOR CUFF REPAIR Left     SOCIAL HISTORY: Social History   Socioeconomic History  . Marital status: Married    Spouse name: Not on file  . Number of children: Not on file  . Years of education: Not on file  . Highest education level: Not on file  Occupational History  . Not on file  Tobacco Use  . Smoking status: Former Smoker    Packs/day: 1.00    Years: 45.00    Pack years: 45.00    Types: Cigarettes    Quit  date: 06/30/1995    Years since quitting: 24.3  . Smokeless tobacco: Never Used  Substance and Sexual Activity  . Alcohol use: Yes    Comment: daily 3 oz alcohol  . Drug use: No  . Sexual activity: Not on file  Other Topics Concern  . Not on file  Social History Narrative  . Not on file   Social Determinants of Health   Financial Resource Strain:   . Difficulty of Paying Living Expenses: Not on file  Food Insecurity:   . Worried About Charity fundraiser in the Last Year: Not on file  . Ran Out of Food in the Last Year: Not on file  Transportation Needs:   . Lack of Transportation (Medical): Not on file  . Lack of Transportation (Non-Medical): Not on file  Physical Activity:   . Days of Exercise per Week: Not on file  . Minutes of Exercise per Session: Not on file  Stress:   . Feeling of Stress : Not on file  Social Connections:   . Frequency of Communication with Friends and Family: Not on file  . Frequency of Social Gatherings with Friends and Family: Not on file  . Attends Religious Services: Not on file  . Active Member of Clubs or Organizations: Not on file  . Attends Archivist Meetings: Not on file  . Marital Status: Not on file  Intimate Partner Violence:   . Fear of Current or Ex-Partner: Not on file  . Emotionally Abused: Not on file  . Physically Abused: Not on file  . Sexually Abused: Not on file    FAMILY HISTORY: History reviewed. No pertinent family history.  ALLERGIES:  has No Known Allergies.  MEDICATIONS:  Current Outpatient Medications  Medication Sig Dispense Refill  . carvedilol (COREG) 3.125 MG tablet Take 1 tablet (3.125 mg total) by mouth 2 (two) times daily. 180 tablet 3  . furosemide (LASIX) 20 MG tablet Take 1 tab every third day 15 tablet 5  . Glucosamine 500 MG CAPS Take 500 mg by mouth daily.     . Melatonin 5 MG TABS Take 5 mg by mouth daily.     . Multiple Vitamin (MULTIVITAMIN WITH MINERALS) TABS tablet Take 1 tablet by  mouth daily.    . Multiple Vitamins-Minerals (PRESERVISION AREDS PO) Take 1 tablet by mouth daily.    . sacubitril-valsartan (ENTRESTO) 49-51 MG Take 1 tablet by mouth 2 (two) times daily. 60 tablet 5  . simvastatin (ZOCOR) 20 MG tablet Take 20 mg by mouth every morning.    . Tafamidis 61 MG CAPS Take 61 mg by mouth daily. 30 capsule 11   No current facility-administered medications for this visit.    REVIEW OF SYSTEMS:    10  Point review of Systems was done is negative except as noted above.  PHYSICAL EXAMINATION: ECOG PERFORMANCE STATUS: 1 - Symptomatic but completely ambulatory  . Vitals:   10/12/19 1046  BP: 139/73  Pulse: 81  Resp: 18  Temp: 97.8 F (36.6 C)  SpO2: 96%   Filed Weights   10/12/19 1046  Weight: 154 lb 14.4 oz (70.3 kg)   .Body mass index is 22.87 kg/m.  GENERAL:alert, in no acute distress and comfortable SKIN: no acute rashes, no significant lesions EYES: conjunctiva are pink and non-injected, sclera anicteric OROPHARYNX: MMM, no exudates, no oropharyngeal erythema or ulceration NECK: supple, no JVD LYMPH:  no palpable lymphadenopathy in the cervical, axillary or inguinal regions LUNGS: clear to auscultation b/l with normal respiratory effort HEART: regular rate & rhythm ABDOMEN:  normoactive bowel sounds , non tender, not distended. Extremity: no pedal edema PSYCH: alert & oriented x 3 with fluent speech NEURO: no focal motor/sensory deficits  LABORATORY DATA:  I have reviewed the data as listed  . CBC Latest Ref Rng & Units 10/12/2019 09/28/2019 09/28/2019  WBC 4.0 - 10.5 K/uL 8.8 - -  Hemoglobin 13.0 - 17.0 g/dL 15.6 15.0 15.0  Hematocrit 39.0 - 52.0 % 46.1 44.0 44.0  Platelets 150 - 400 K/uL 233 - -    . CMP Latest Ref Rng & Units 10/12/2019 10/04/2019 09/28/2019  Glucose 70 - 99 mg/dL 98 97 -  BUN 8 - 23 mg/dL 34(H) 35(H) -  Creatinine 0.61 - 1.24 mg/dL 1.34(H) 1.31(H) -  Sodium 135 - 145 mmol/L 143 141 143  Potassium 3.5 - 5.1  mmol/L 4.7 4.3 4.1  Chloride 98 - 111 mmol/L 105 104 -  CO2 22 - 32 mmol/L 25 26 -  Calcium 8.9 - 10.3 mg/dL 9.9 9.2 -  Total Protein 6.5 - 8.1 g/dL 7.3 - -  Total Bilirubin 0.3 - 1.2 mg/dL 0.8 - -  Alkaline Phos 38 - 126 U/L 69 - -  AST 15 - 41 U/L 32 - -  ALT 0 - 44 U/L 21 - -   Component     Latest Ref Rng & Units 09/12/2019  IgG (Immunoglobin G), Serum     603 - 1,613 mg/dL 589 (L)  IgA     61 - 437 mg/dL 158  IgM (Immunoglobulin M), Srm     15 - 143 mg/dL 18  Total Protein ELP     6.0 - 8.5 g/dL 6.8  Albumin SerPl Elph-Mcnc     2.9 - 4.4 g/dL 3.8  Alpha 1     0.0 - 0.4 g/dL 0.3  Alpha2 Glob SerPl Elph-Mcnc     0.4 - 1.0 g/dL 1.1 (H)  B-Globulin SerPl Elph-Mcnc     0.7 - 1.3 g/dL 1.1  Gamma Glob SerPl Elph-Mcnc     0.4 - 1.8 g/dL 0.5  M Protein SerPl Elph-Mcnc     Not Observed g/dL Comment (A)  Globulin, Total     2.2 - 3.9 g/dL 3.0  Albumin/Glob SerPl     0.7 - 1.7 1.3  IFE 1      Comment (A)  Please Note (HCV):      Comment   Multiple Myeloma Panel (SPEP&IFE w/QIG)      The value has a corrected status.      Reference Range & Units: Not Observed g/dL      Resulting Lab: [facility]      Comments:           (  NOTE)           Immunofixation detected an M-spike in the beta fraction of            the           Protein Electrophoresis; unable to quantify due to the            co-migration           with other proteins.            Multiple Myeloma Panel (SPEP&IFE w/QIG)      The value has a corrected status.      No reference range information available      Resulting Lab: [facility]      Comments:           (NOTE)           Immunofixation shows IgA monoclonal protein with lambda            light chain           specificity.   RADIOGRAPHIC STUDIES: I have personally reviewed the radiological images as listed and agreed with the findings in the report. NM CARDIAC AMYLOID TUMOR LOC INFLAM SPECT 1 DAY  Result  Date: 10/04/2019 CLINICAL DATA:  HEART FAILURE. CONCERN FOR CARDIAC AMYLOIDOSIS. 86 year old male. EXAM: NUCLEAR MEDICINE TUMOR LOCALIZATION. PYP CARDIAC AMYLOIDOSIS SCAN WITH SPECT TECHNIQUE: Following intravenous administration of radiopharmaceutical, anterior planar images of the chest were obtained. Regions of interest were placed on the heart and contralateral chest wall for quantitative assessment. Additional SPECT imaging of the chest was obtained. RADIOPHARMACEUTICALS:  20.2 mCi TECHNETIUM 99 PYROPHOSPHATE FINDINGS: Planar Visual assessment: Anterior planar imaging demonstrates radiotracer uptake within the heart greater than uptake within the adjacent ribs (Grade 3). Quantitative assessment : Quantitative assessment of the cardiac uptake compared to the contralateral chest wall is equal to 2.8 (H/CL = 2.8). SPECT assessment: SPECT imaging of the chest demonstrates intense radiotracer accumulation within the LEFT ventricle. IMPRESSION: Visual and quantitative assessment (grade 3, H/CLL equal 2.8) are strongly suggestive of transthyretin amyloidosis. Electronically Signed   By: Suzy Bouchard M.D.   On: 10/04/2019 08:37   CARDIAC CATHETERIZATION  Result Date: 09/28/2019 1. Normal filling pressures. 2. Low cardiac output. 3. Minimal coronary disease. Nonischemic cardiomyopathy.    ASSESSMENT & PLAN:   86 yo with   1) Concern for Cardiac Amyloidosis with non ischemic cardiomyopathy with systolic dysfunction EF 00% Cardiac TECHNETIUM 99 PYROPHOSPHATE - strongly suggestive of transthyretin amyloidosis.  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 PLAN -Discussed 09/12/2019 CBC shows all values are WNL -Discussed 10/04/2019 BMP shows all values are WNL except for BUN at 35, Creatinine at 1.31, GFR Est Non Af Am at 48 -Discussed 09/12/2019 MMP shows all values are WNL except for IgG at 589, Alpha2 Glob at 1.1, M Protein spike found, but unable to quantify results, IFE  1 shows "IgA monoclonal protein with lambda light chain specificity." -Discussed 10/03/2019 NM Cardiac Amyloid Tumor ([id number]) which revealed "Visual and quantitative assessment (grade 3, H/CLL equal 2.8) are strongly suggestive of transthyretin amyloidosis." -Advised pt that it is more likely that he has transthyretin amyloidosis, as opposed to light chain amyloidosis based on labs and cardiac TPP scan -Advised pt that the most definitive way to diagnose his amyloidosis would be via a endomyocardial biopsy-- which the patient does not feel is in keeping with his goals of care. -Pt does not want to get a heart biopsy  and would not like aggressive testing like a bone marrow biopsy. -Pt notes that is this were AL Amyloidosis he would likely not want any consideration for treatment for this. -Would recommend getting labs today but avoiding more invasive testing based on pt's preferences  -Will get labs today as noted below. -provided handout regarding Amyloidosis for his education and to help address possible inheritance if this was confirmed to be an inheritable variety of Amyloidosis. -Will see back in 2 weeks via phone  . Orders Placed This Encounter  Procedures  . CBC with Differential/Platelet    Standing Status:   Future    Number of Occurrences:   1    Standing Expiration Date:   11/15/2020  . CMP ([place] only)    Standing Status:   Future    Number of Occurrences:   1    Standing Expiration Date:   10/11/2020  . Multiple Myeloma Panel (SPEP&IFE w/QIG)    Standing Status:   Future    Number of Occurrences:   1    Standing Expiration Date:   10/11/2020  . Kappa/lambda light chains    Standing Status:   Future    Number of Occurrences:   1    Standing Expiration Date:   11/15/2020  . Beta 2 microglobulin, serum    Standing Status:   Future    Number of Occurrences:   1    Standing Expiration Date:   10/11/2020    FOLLOW UP: Labs today Phone visit with Dr Irene Limbo in 2  weeks  All of the patients questions were answered with apparent satisfaction. The patient knows to call the clinic with any problems, questions or concerns.  I spent 40 mins counseling the patient face to face. The total time spent in the appointment was 60 mins and more than 50% was on counseling and direct patient cares.    Sullivan Lone MD [place] AAHIVMS [facility] Hematology/Oncology Physician [facility]  (Office):       [phone] (Work cell):  [phone] (Fax):           [phone]  10/12/2019 4:42 PM  I, Yevette Edwards, am acting as a scribe for Dr. Sullivan Lone.   .I have reviewed the above documentation for accuracy and completeness, and I agree with the above. Brunetta Genera MD

## 2019-10-13 ENCOUNTER — Telehealth (HOSPITAL_COMMUNITY): Payer: Self-pay | Admitting: Pharmacy Technician

## 2019-10-13 LAB — KAPPA/LAMBDA LIGHT CHAINS
Kappa free light chain: 20.3 mg/L — ABNORMAL HIGH (ref 3.3–19.4)
Kappa, lambda light chain ratio: 1.85 — ABNORMAL HIGH (ref 0.26–1.65)
Lambda free light chains: 11 mg/L (ref 5.7–26.3)

## 2019-10-13 LAB — BETA 2 MICROGLOBULIN, SERUM: Beta-2 Microglobulin: 2.4 mg/L (ref 0.6–2.4)

## 2019-10-13 NOTE — Telephone Encounter (Signed)
Signed patient up for PAN wait list for Amyloid fund.   Will submit prior authorization for medication and contact [employer] if co-pay is unaffordable.  Will follow up.  Charlann Boxer, CPhT

## 2019-10-14 ENCOUNTER — Ambulatory Visit: Payer: Medicare Other | Admitting: Pulmonary Disease

## 2019-10-14 ENCOUNTER — Other Ambulatory Visit: Payer: Self-pay

## 2019-10-14 ENCOUNTER — Ambulatory Visit (HOSPITAL_COMMUNITY)
Admission: RE | Admit: 2019-10-14 | Discharge: 2019-10-14 | Disposition: A | Discharge status: Home / Self Care | Payer: Medicare Other | Source: Ambulatory Visit | Attending: Cardiology | Admitting: Cardiology

## 2019-10-14 DIAGNOSIS — I5022 Chronic systolic (congestive) heart failure: Secondary | ICD-10-CM | POA: Insufficient documentation

## 2019-10-14 LAB — BASIC METABOLIC PANEL
Anion gap: 9 (ref 5–15)
BUN: 32 mg/dL — ABNORMAL HIGH (ref 8–23)
CO2: 28 mmol/L (ref 22–32)
Calcium: 9.9 mg/dL (ref 8.9–10.3)
Chloride: 107 mmol/L (ref 98–111)
Creatinine, Ser: 1.31 mg/dL — ABNORMAL HIGH (ref 0.61–1.24)
GFR calc Af Amer: 56 mL/min — ABNORMAL LOW (ref >60)
GFR calc non Af Amer: 48 mL/min — ABNORMAL LOW (ref >60)
Glucose, Bld: 97 mg/dL (ref 70–99)
Potassium: 5.2 mmol/L — ABNORMAL HIGH (ref 3.5–5.1)
Sodium: 144 mmol/L (ref 135–145)

## 2019-10-14 NOTE — Telephone Encounter (Signed)
Patient Advocate Encounter   Received notification from [facility] that prior authorization for Vyndamax is required.   PA submitted on CoverMyMeds Key  [id number] Status is pending   Will continue to follow.  Charlann Boxer, CPhT

## 2019-10-14 NOTE — Telephone Encounter (Signed)
Advanced Heart Failure Patient Advocate Encounter  Prior Authorization for Corey Harrison has been approved.    PA# [id number] Effective dates: 10/14/19 through 09/28/20  Patients co-pay is $2,784.03  Will reach back out to [facility] concerning patient assistance.  Charlann Boxer, CPhT

## 2019-10-17 LAB — MULTIPLE MYELOMA PANEL, SERUM
Albumin SerPl Elph-Mcnc: 4 g/dL (ref 2.9–4.4)
Albumin/Glob SerPl: 1.5 (ref 0.7–1.7)
Alpha 1: 0.3 g/dL (ref 0.0–0.4)
Alpha2 Glob SerPl Elph-Mcnc: 0.8 g/dL (ref 0.4–1.0)
B-Globulin SerPl Elph-Mcnc: 1 g/dL (ref 0.7–1.3)
Gamma Glob SerPl Elph-Mcnc: 0.5 g/dL (ref 0.4–1.8)
Globulin, Total: 2.7 g/dL (ref 2.2–3.9)
IgA: 162 mg/dL (ref 61–437)
IgG (Immunoglobin G), Serum: 635 mg/dL (ref 603–1613)
IgM (Immunoglobulin M), Srm: 18 mg/dL (ref 15–143)
Total Protein ELP: 6.7 g/dL (ref 6.0–8.5)

## 2019-10-18 NOTE — Telephone Encounter (Signed)
Called [employer], will email the insurance prior approval in to them. Provided them with the patient's co-pay. They are going to reach out to the patient regarding patient assistance.  Charlann Boxer, CPhT

## 2019-10-21 NOTE — Telephone Encounter (Addendum)
Patient is over the income guideline for patient assistance through [employer]. Spoke to patient regarding co-pay and according to insurance, RPH was able to see that the max patient would pay out of pocket for medication was $3200 (in his case would be two fills of Vyndamax). Total OOP would be $0 after that.   Spoke with patient, he would prefer to get the medication from [facility], will call them on Monday to make sure they can fill Vyndamax. If they cannot, we will send the prescription over to [facility].  Will follow up.  Charlann Boxer, CPhT

## 2019-10-24 NOTE — Telephone Encounter (Signed)
[place] [facility] does not have the ability to get Vyndamax from their supplier at this time. Called and left patient voicemail to call me back so that we may set him up with our specialty pharmacy.  Will follow up.  Charlann Boxer, CPhT

## 2019-10-25 ENCOUNTER — Other Ambulatory Visit: Payer: Self-pay

## 2019-10-25 ENCOUNTER — Encounter (HOSPITAL_COMMUNITY): Payer: Self-pay | Admitting: Cardiology

## 2019-10-25 ENCOUNTER — Telehealth (HOSPITAL_COMMUNITY): Payer: Self-pay | Admitting: Pharmacist

## 2019-10-25 ENCOUNTER — Ambulatory Visit (HOSPITAL_COMMUNITY)
Admission: RE | Admit: 2019-10-25 | Discharge: 2019-10-25 | Disposition: A | Discharge status: Home / Self Care | Payer: Medicare Other | Source: Ambulatory Visit | Attending: Cardiology | Admitting: Cardiology

## 2019-10-25 VITALS — BP 130/62 | HR 57 | Wt 158.2 lb

## 2019-10-25 DIAGNOSIS — E785 Hyperlipidemia, unspecified: Secondary | ICD-10-CM | POA: Diagnosis not present

## 2019-10-25 DIAGNOSIS — I5022 Chronic systolic (congestive) heart failure: Secondary | ICD-10-CM | POA: Insufficient documentation

## 2019-10-25 DIAGNOSIS — M1990 Unspecified osteoarthritis, unspecified site: Secondary | ICD-10-CM | POA: Insufficient documentation

## 2019-10-25 DIAGNOSIS — E8582 Wild-type transthyretin-related (ATTR) amyloidosis: Secondary | ICD-10-CM

## 2019-10-25 DIAGNOSIS — E852 Heredofamilial amyloidosis, unspecified: Secondary | ICD-10-CM

## 2019-10-25 DIAGNOSIS — Z79899 Other long term (current) drug therapy: Secondary | ICD-10-CM | POA: Diagnosis not present

## 2019-10-25 DIAGNOSIS — I428 Other cardiomyopathies: Secondary | ICD-10-CM | POA: Insufficient documentation

## 2019-10-25 DIAGNOSIS — I110 Hypertensive heart disease with heart failure: Secondary | ICD-10-CM | POA: Diagnosis not present

## 2019-10-25 DIAGNOSIS — J449 Chronic obstructive pulmonary disease, unspecified: Secondary | ICD-10-CM | POA: Diagnosis not present

## 2019-10-25 DIAGNOSIS — Z8546 Personal history of malignant neoplasm of prostate: Secondary | ICD-10-CM | POA: Insufficient documentation

## 2019-10-25 DIAGNOSIS — E859 Amyloidosis, unspecified: Secondary | ICD-10-CM | POA: Diagnosis not present

## 2019-10-25 LAB — BASIC METABOLIC PANEL
Anion gap: 8 (ref 5–15)
BUN: 28 mg/dL — ABNORMAL HIGH (ref 8–23)
CO2: 26 mmol/L (ref 22–32)
Calcium: 9.6 mg/dL (ref 8.9–10.3)
Chloride: 105 mmol/L (ref 98–111)
Creatinine, Ser: 1.26 mg/dL — ABNORMAL HIGH (ref 0.61–1.24)
GFR calc Af Amer: 59 mL/min — ABNORMAL LOW (ref >60)
GFR calc non Af Amer: 51 mL/min — ABNORMAL LOW (ref >60)
Glucose, Bld: 102 mg/dL — ABNORMAL HIGH (ref 70–99)
Potassium: 4.5 mmol/L (ref 3.5–5.1)
Sodium: 139 mmol/L (ref 135–145)

## 2019-10-25 MED ORDER — CARVEDILOL 6.25 MG PO TABS: 6.2500 mg | ORAL_TABLET | Freq: Two times a day (BID) | ORAL | 3 refills | Status: DC

## 2019-10-25 MED ORDER — TAFAMIDIS 61 MG PO CAPS: 61.0000 mg | ORAL_CAPSULE | Freq: Every day | ORAL | 11 refills | Status: DC

## 2019-10-25 NOTE — Telephone Encounter (Signed)
Vyndamax prescription sent to [facility].   Audry Riles, PharmD, BCPS, BCCP, CPP Heart Failure Clinic Pharmacist [phone]

## 2019-10-25 NOTE — Progress Notes (Signed)
PCP: Dr. Inda Merlin Cardiology: Dr. Aundra Dubin  86 yo returns for followup of CHF.  I took care of his wife in the past.  He had been generally health up until this fall.  He developed worsening fatigue as well as exertional dyspnea.  His PCP started him on Lasix 20 mg daily, which helped his breathing.  Echo was done (cannot review as done at non-[facility] facility) showing severe LVH with a speckled pattern concerning for amyloidosis.  Also noted was EF of 20%, diffuse hypokinesis, and moderate RV dysfunction.  LHC/RHC was done in 12/20, showing no significant CAD and optimized filling pressures with low cardiac output.    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    He saw Dr. Irene Limbo for evaluation of monoclonal gammopathy.  He would not want bone marrow biopsy or endomyocardial biopsy, and he would not want chemotherapy if multiple myeloma were found.  Dr. Irene Limbo sent off further tests and plans to followup with him later this week.   He continues to have fluctuating symptoms, some days he feels very good and like normal, on other days he gets very fatigued.  Mild dyspnea walking up a flight of stairs but generally does ok on flat ground.  No chest pain.  Stable weight.  No lightheadedness but feels like his balance is not great.  No orthopnea/PND.    ECG (personally reviewed): NSR, LVH with repolarization abnormality, old ASMI.   Labs (12/20): K 5.2 => 4.9, creatinine 1.26 => 1.32, BNP 628, hgb 14.4.  Serum immunofixation with IgA monoclonal protein (light chain).  Urine immunofixation negative.  Labs (1/21): K 5.2, creatinine 1.31  PMH: 1. HTN 2. Hyperlipidemia 3. Prostate cancer 4. Osteoarthritis 5. Macular degeneration.  6. Chronic systolic CHF:  - Echo (10/25): EF 55-60%, normal RV.  - Echo (11/20): Severe LVH with speckled pattern concerning for amyloidosis, EF 20% with diffuse hypokinesis, moderately decreased RV  systolic function.  - LHC/RHC (12/20): No significant CAD.  Mean RA 2, PA 36/15, mean PCWP 14, CI 2.06.  - PYP scan: grade 3, H/CL ratio 2.8. Strongly suggestive of transthyretin amyloidosis.  [employer] gene testing negative for typical ATTR mutations, suggesting wild-type ATTR.  7. Monoclonal gammopathy  SH: Widower, retired, lives in [place].  Nonsmoker, occasional ETOH (not heavy).   FH: No history of premature CAD or cardiomyopathy.   ROS: All systems reviewed and negative except as per HPI.   Current Outpatient Medications  Medication Sig Dispense Refill  . carvedilol (COREG) 6.25 MG tablet Take 1 tablet (6.25 mg total) by mouth 2 (two) times daily. 60 tablet 3  . furosemide (LASIX) 20 MG tablet Take 1 tab every third day 15 tablet 5  . Glucosamine 500 MG CAPS Take 500 mg by mouth daily.     . Melatonin 5 MG TABS Take 5 mg by mouth daily.     . Multiple Vitamin (MULTIVITAMIN WITH MINERALS) TABS tablet Take 1 tablet by mouth daily.    . Multiple Vitamins-Minerals (PRESERVISION AREDS PO) Take 1 tablet by mouth daily.    . sacubitril-valsartan (ENTRESTO) 49-51 MG Take 1 tablet by mouth 2 (two) times daily. 60 tablet 5  . simvastatin (ZOCOR) 20 MG tablet Take 20 mg by mouth every morning.    . Tafamidis 61 MG CAPS Take 61 mg by mouth daily. 30 capsule 11   No current facility-administered medications for this encounter.   BP 130/62   Pulse (!) 57  Wt 71.8 kg (158 lb 3.2 oz)   SpO2 97%   BMI 23.36 kg/m  General: NAD Neck: No JVD, no thyromegaly or thyroid nodule.  Lungs: Clear to auscultation bilaterally with normal respiratory effort. CV: Nondisplaced PMI.  Heart regular S1/S2, no S3/S4, no murmur.  No peripheral edema.  No carotid bruit.  Normal pedal pulses.  Abdomen: Soft, nontender, no hepatosplenomegaly, no distention.  Skin: Intact without lesions or rashes.  Neurologic: Alert and oriented x 3.  Psych: Normal affect. Extremities: No clubbing or cyanosis.  HEENT:  Normal.   Assessment/Plan: 1. Chronic systolic CHF: Nonischemic cardiomyopathy. Echo (11/20) with severe LVH and speckled pattern concerning for cardiac amyloidosis, EF 20%, moderately decreased RV systolic function.  LHC/RHC in 12/20 showed no significant CAD, filling pressures not elevated but cardiac output moderately depressed.  Appearance of myocardium is suggestive of amyloidosis, but this usually is not associated with a profound fall in EF. He does have symptoms that may be consistent with peripheral neuropathy.  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  NYHA class II symptoms, not volume overloaded on exam.  - Continue Entresto 49/51 bid, BMET today. .  - Increase Coreg to 6.25 mg bid.   - K was 5.2 when last checked, will not start spironolactone yet.   - He can continue Lasix 20 mg every 3rd day.   2. ?COPD: Prior smoker. Has seen pulmonary.  - To get PFTs.  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  Has some symptoms of peripheral neuropathy.  Myocardial appearance on echo is suggestive of amyloidosis.  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  However, serum immunofixation did show a monoclonal IgA light chain.  I suspect that this is TTR amyloidosis given the strong positivity of the PYP scan.  He was seen by hematology given the monoclonal light chain.  He does not want aggressive workup of the monoclonal gammopathy (no biopsies, would not want chemotherapy if multiple myeloma were found).  Additional labs were done and he will be followup up later this week with Dr. Irene Limbo. [employer]ae gene testing was negative for the common ATTR mutations, suggesting wild type ATTR amyloidosis.   - He is going to start on tafamidis for TTR cardiac amyloidosis.   Followup with me in 1 month.   Loralie Champagne 10/25/2019

## 2019-10-25 NOTE — Telephone Encounter (Signed)
Spoke with patient and enrolled him in [employer] program. They will call him monthly to set up shipping of Vyndamax.   Advised him to call me with any issues.  Charlann Boxer, CPhT

## 2019-10-25 NOTE — Patient Instructions (Signed)
INCREASE Coreg to 6.25mg  (1 tab) twice a day  Labs today We will only contact you if something comes back abnormal or we need to make some changes. Otherwise no news is good news!  Your physician recommends that you schedule a follow-up appointment in: 1 month on Tuesday March 2nd, 2021 at 11:40am  Please call office at [phone] option 2 if you have any questions or concerns.   At the [facility], you and your health needs are our priority. As part of our continuing mission to provide you with exceptional heart care, we have created designated Provider Care Teams. These Care Teams include your primary Cardiologist (physician) and Advanced Practice Providers (APPs- Physician Assistants and Nurse Practitioners) who all work together to provide you with the care you need, when you need it.   You may see any of the following providers on your designated Care Team at your next follow up: Marland Kitchen Dr Glori Bickers . Dr Loralie Champagne . Darrick Grinder, NP . Lyda Jester, PA . Audry Riles, PharmD   Please be sure to bring in all your medications bottles to every appointment.  \

## 2019-10-26 MED FILL — VYNDAMAX 61 MG CAPS: 61 | 30 days supply | Qty: 30 | Fill #0

## 2019-10-27 NOTE — Progress Notes (Signed)
HEMATOLOGY/ONCOLOGY CLINIC NOTE  Date of Service: 10/28/2019  Patient Care Team: Josetta Huddle, MD as PCP - General (Internal Medicine)  CHIEF COMPLAINTS/PURPOSE OF CONSULTATION:  Concern for Amyloidosis  HISTORY OF PRESENTING ILLNESS:   Corey Harrison is a wonderful 86 y.o. male who has been referred to [place] by Dr Aundra Dubin for evaluation and management of concern for cardiac amyloidosis. The pt reports that he is doing well overall.  The pt reports that about 3-4 months ago he began to notice that he was more weak, tired and was having some difficulty breathing and lightheadedness. Before this occurred pt was engaging an age appropriate work out regimen and has continued to remain active since his youth.  Pt has HTN and has a Hx of Prostate Cancer. Pt used to smoke but quit over 20 years ago. He currently drinks 3-4 oz of Vodka per day and used to drink a high volume of beer 30-40 years ago while in the [employer]. Pt has been on Losartan and Simvastatin for many years. Pt denies any previous diagnosis of RA or any known liver issues. Pt has been deeply affected by the passing of his wife, who he lost two years ago.   Pt has an upcoming appointment with Dr. Aundra Dubin in the next few weeks. They have not had a chance to discuss the 10/03/2019 Cardiac TPP scan as of yet. His symptoms have improved since he was seen by Dr. Aundra Dubin the first time. He is concerned about the inheritance of his amyloidosis, as he has 3 children.    Of note since the patient's last visit, pt has had NM Cardiac Amyloid Tumor ([id number]) completed on 10/03/2019 with results revealing "Visual and quantitative assessment (grade 3, H/CLL equal 2.8) are strongly suggestive of transthyretin amyloidosis."  Most recent lab results (09/12/2019) of CBC is as follows: all values are WNL. 10/04/2019 BMP is as follows: all values are WNL except for BUN at 35, Creatinine at 1.31, GFR Est Non Af Am at 48. 09/12/2019 MMP is as  follows:  all values are WNL except for IgG at 589, Alpha2 Glob at 1.1, M Protein spike found, but unable to quantify results, IFE 1 shows "IgA monoclonal protein with lambda light chain specificity."  On review of systems, pt denies abdominal pain, leg swelling, new bone pain, back pain and any other symptoms.   On PMHx the pt reports HTN, Prostate Cancer. On Social Hx the pt reports that he is a previous smoker (quit 20+ years ago), he currently drinks 3-4 oz of hard liquor daily - used to drink higher volume of beer 30-40 years ago  INTERVAL HISTORY:  I connected with Talmage Nap on 10/28/19 at  8:40 AM EST by TeleHealth and verified that I am speaking with the correct person using two identifiers.   I discussed the limitations, risks, security and privacy concerns of performing an evaluation and management service by telemedicine and the availability of in-person appointments. I also discussed with the patient that there may be a patient responsible charge related to this service. The patient expressed understanding and agreed to proceed.   Other persons participating in the visit and their role in the encounter: [place]   Patient's location: Home  Provider's location: [place]   Chief Complaint: Concern for Amyloidosis  Corey Harrison is a 86 y.o. male here for evaluation and management of  Amyloidosis. The patient's last visit with [place] was on 10/12/2019. The pt reports that  he is doing well overall.  The pt reports that Dr. Aundra Dubin prescribed Tafamidis for transthyretin Amyloidosis.  Lab results today (10/12/2019) of CBC w/diff and CMP is as follows: all values are WNL except for RDW at 15.8, Kappa free light chain at 20.3, Kappa lamda light chain ratio at 1.85, BUN at 34, Creatinine at 1.34, GFR Est Non Af Am at 47, GFR Est AFR Am at 54.  On review of systems, pt reports no acute new symptoms. No CP no overt SOB.   MEDICAL HISTORY:  Past Medical History:    Diagnosis Date  . Cancer [facility]) 1996   prostate  . Hypertension     SURGICAL HISTORY: Past Surgical History:  Procedure Laterality Date  . CARPAL TUNNEL RELEASE Right dec 2005  . CARPAL TUNNEL RELEASE Left jan 2006  . COLONOSCOPY WITH PROPOFOL N/A 12/20/2013   Procedure: COLONOSCOPY WITH PROPOFOL;  Surgeon: Garlan Fair, MD;  Location: [facility];  Service: Endoscopy;  Laterality: N/A;  . endoscopic sinus surgery  sept 1993  . EYE SURGERY  06-2012   right eye cataract  . HERNIA REPAIR  oct 1996   diaphragmatic  . HERNIA REPAIR Right jan 2003  . left hernia repair  dec 2007  . prostate surgery for cancer  02-1998  . rectal fistula repair  1974  . RIGHT/LEFT HEART CATH AND CORONARY ANGIOGRAPHY N/A 09/28/2019   Procedure: RIGHT/LEFT HEART CATH AND CORONARY ANGIOGRAPHY;  Surgeon: Larey Dresser, MD;  Location: [facility];  Service: Cardiovascular;  Laterality: N/A;  . ROTATOR CUFF REPAIR Left     SOCIAL HISTORY: Social History   Socioeconomic History  . Marital status: Married    Spouse name: Not on file  . Number of children: Not on file  . Years of education: Not on file  . Highest education level: Not on file  Occupational History  . Not on file  Tobacco Use  . Smoking status: Former Smoker    Packs/day: 1.00    Years: 45.00    Pack years: 45.00    Types: Cigarettes    Quit date: 06/30/1995    Years since quitting: 24.3  . Smokeless tobacco: Never Used  Substance and Sexual Activity  . Alcohol use: Yes    Comment: daily 3 oz alcohol  . Drug use: No  . Sexual activity: Not on file  Other Topics Concern  . Not on file  Social History Narrative  . Not on file   Social Determinants of Health   Financial Resource Strain:   . Difficulty of Paying Living Expenses: Not on file  Food Insecurity:   . Worried About Charity fundraiser in the Last Year: Not on file  . Ran Out of Food in the Last Year: Not on file  Transportation Needs:   . Lack of  Transportation (Medical): Not on file  . Lack of Transportation (Non-Medical): Not on file  Physical Activity:   . Days of Exercise per Week: Not on file  . Minutes of Exercise per Session: Not on file  Stress:   . Feeling of Stress : Not on file  Social Connections:   . Frequency of Communication with Friends and Family: Not on file  . Frequency of Social Gatherings with Friends and Family: Not on file  . Attends Religious Services: Not on file  . Active Member of Clubs or Organizations: Not on file  . Attends Archivist Meetings: Not on file  . Marital Status:  Not on file  Intimate Partner Violence:   . Fear of Current or Ex-Partner: Not on file  . Emotionally Abused: Not on file  . Physically Abused: Not on file  . Sexually Abused: Not on file    FAMILY HISTORY: No family history on file.  ALLERGIES:  has No Known Allergies.  MEDICATIONS:  Current Outpatient Medications  Medication Sig Dispense Refill  . carvedilol (COREG) 6.25 MG tablet Take 1 tablet (6.25 mg total) by mouth 2 (two) times daily. 60 tablet 3  . furosemide (LASIX) 20 MG tablet Take 1 tab every third day 15 tablet 5  . Glucosamine 500 MG CAPS Take 500 mg by mouth daily.     . Melatonin 5 MG TABS Take 5 mg by mouth daily.     . Multiple Vitamin (MULTIVITAMIN WITH MINERALS) TABS tablet Take 1 tablet by mouth daily.    . Multiple Vitamins-Minerals (PRESERVISION AREDS PO) Take 1 tablet by mouth daily.    . sacubitril-valsartan (ENTRESTO) 49-51 MG Take 1 tablet by mouth 2 (two) times daily. 60 tablet 5  . simvastatin (ZOCOR) 20 MG tablet Take 20 mg by mouth every morning.    . Tafamidis 61 MG CAPS Take 61 mg by mouth daily. 30 capsule 11   No current facility-administered medications for this visit.    REVIEW OF SYSTEMS:   A 10+ POINT REVIEW OF SYSTEMS WAS OBTAINED including neurology, dermatology, psychiatry, cardiac, respiratory, lymph, extremities, GI, GU, Musculoskeletal, constitutional, breasts,  reproductive, HEENT.  All pertinent positives are noted in the HPI.  All others are negative.    PHYSICAL EXAMINATION: There were no vitals filed for this visit. Wt Readings from Last 3 Encounters:  10/25/19 158 lb 3.2 oz (71.8 kg)  10/12/19 154 lb 14.4 oz (70.3 kg)  10/04/19 155 lb 9.6 oz (70.6 kg)   There is no height or weight on file to calculate BMI.    Telehealth Visit 10/28/19   LABORATORY DATA:  I have reviewed the data as listed  . CBC Latest Ref Rng & Units 10/12/2019 09/28/2019 09/28/2019  WBC 4.0 - 10.5 K/uL 8.8 - -  Hemoglobin 13.0 - 17.0 g/dL 15.6 15.0 15.0  Hematocrit 39.0 - 52.0 % 46.1 44.0 44.0  Platelets 150 - 400 K/uL 233 - -    . CMP Latest Ref Rng & Units 10/25/2019 10/14/2019 10/12/2019  Glucose 70 - 99 mg/dL 102(H) 97 98  BUN 8 - 23 mg/dL 28(H) 32(H) 34(H)  Creatinine 0.61 - 1.24 mg/dL 1.26(H) 1.31(H) 1.34(H)  Sodium 135 - 145 mmol/L 139 144 143  Potassium 3.5 - 5.1 mmol/L 4.5 5.2(H) 4.7  Chloride 98 - 111 mmol/L 105 107 105  CO2 22 - 32 mmol/L '26 28 25  ' Calcium 8.9 - 10.3 mg/dL 9.6 9.9 9.9  Total Protein 6.5 - 8.1 g/dL - - 7.3  Total Bilirubin 0.3 - 1.2 mg/dL - - 0.8  Alkaline Phos 38 - 126 U/L - - 69  AST 15 - 41 U/L - - 32  ALT 0 - 44 U/L - - 21   Component     Latest Ref Rng & Units 09/12/2019  IgG (Immunoglobin G), Serum     603 - 1,613 mg/dL 589 (L)  IgA     61 - 437 mg/dL 158  IgM (Immunoglobulin M), Srm     15 - 143 mg/dL 18  Total Protein ELP     6.0 - 8.5 g/dL 6.8  Albumin SerPl Elph-Mcnc     2.9 -  4.4 g/dL 3.8  Alpha 1     0.0 - 0.4 g/dL 0.3  Alpha2 Glob SerPl Elph-Mcnc     0.4 - 1.0 g/dL 1.1 (H)  B-Globulin SerPl Elph-Mcnc     0.7 - 1.3 g/dL 1.1  Gamma Glob SerPl Elph-Mcnc     0.4 - 1.8 g/dL 0.5  M Protein SerPl Elph-Mcnc     Not Observed g/dL Comment (A)  Globulin, Total     2.2 - 3.9 g/dL 3.0  Albumin/Glob SerPl     0.7 - 1.7 1.3  IFE 1      Comment (A)  Please Note (HCV):      Comment   Multiple Myeloma Panel  (SPEP&IFE w/QIG)      The value has a corrected status.      Reference Range & Units: Not Observed g/dL      Resulting Lab: [facility]      Comments:           (NOTE)           Immunofixation detected an M-spike in the beta fraction of            the           Protein Electrophoresis; unable to quantify due to the            co-migration           with other proteins.            Multiple Myeloma Panel (SPEP&IFE w/QIG)      The value has a corrected status.      No reference range information available      Resulting Lab: [facility]      Comments:           (NOTE)           Immunofixation shows IgA monoclonal protein with lambda            light chain           specificity.  MMP 10/12/2019:  RADIOGRAPHIC STUDIES: I have personally reviewed the radiological images as listed and agreed with the findings in the report. NM CARDIAC AMYLOID TUMOR LOC INFLAM SPECT 1 DAY  Result Date: 10/04/2019 CLINICAL DATA:  HEART FAILURE. CONCERN FOR CARDIAC AMYLOIDOSIS. 86 year old male. EXAM: NUCLEAR MEDICINE TUMOR LOCALIZATION. PYP CARDIAC AMYLOIDOSIS SCAN WITH SPECT TECHNIQUE: Following intravenous administration of radiopharmaceutical, anterior planar images of the chest were obtained. Regions of interest were placed on the heart and contralateral chest wall for quantitative assessment. Additional SPECT imaging of the chest was obtained. RADIOPHARMACEUTICALS:  20.2 mCi TECHNETIUM 99 PYROPHOSPHATE FINDINGS: Planar Visual assessment: Anterior planar imaging demonstrates radiotracer uptake within the heart greater than uptake within the adjacent ribs (Grade 3). Quantitative assessment : Quantitative assessment of the cardiac uptake compared to the contralateral chest wall is equal to 2.8 (H/CL = 2.8). SPECT assessment: SPECT imaging of the chest demonstrates intense radiotracer accumulation within the LEFT ventricle. IMPRESSION: Visual and quantitative assessment  (grade 3, H/CLL equal 2.8) are strongly suggestive of transthyretin amyloidosis. Electronically Signed   By: Suzy Bouchard M.D.   On: 10/04/2019 08:37   CARDIAC CATHETERIZATION  Result Date: 09/28/2019 1. Normal filling pressures. 2. Low cardiac output. 3. Minimal coronary disease. Nonischemic cardiomyopathy.    ASSESSMENT & PLAN:   86 yo with   1) Concern for Cardiac Amyloidosis with non ischemic cardiomyopathy with systolic dysfunction EF 16% Cardiac TECHNETIUM 99 PYROPHOSPHATE - strongly  suggestive of transthyretin amyloidosis.  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  PLAN -Discussed pt labwork today, 10/12/2019;  all values are WNL except for RDW at 15.8, Kappa free light chain at 20.3, Kappa lamda light chain ratio at 1.85, BUN at 34, Creatinine at 1.34, GFR Est Non Af Am at 47, GFR Est AFR Am at 54. -Discussed that labs and scan suggest transthyretin amyloidosis - Less likely related to light chains.  -Advised it is acceptable to hold on bone marrow biopsy but advised that bone marrow biopsy would help develop a more infinitive answer. -Advised that transthyretin amyloidosis is managed by his cardiologist  -Discussed that transthyretin amyloidosis could be inherited or acquired  - Advised to continue to follow up with Cardiologist for continued mx treatment.  -Will contact genetics councilor to see if there is any testing that can be conducted with [facility]  - referral made to [facility].  Orders Placed This Encounter  Procedures  . Ambulatory referral to Genetics    Referral Priority:   Routine    Referral Type:   Consultation    Referral Reason:   Specialty Services Required    Referred to Provider:   Debbe Mounts, PhD    Requested Specialty:   Genetic Counselor    Number of Visits Requested:   1    FOLLOW UP: RTC with Dr Aundra Dubin andDrGates  The total time spent in the appt was 20 minutes and more than 50% was on counseling and direct  patient cares.  All of the patient's questions were answered with apparent satisfaction. The patient knows to call the clinic with any problems, questions or concerns.     Sullivan Lone MD [place] AAHIVMS [facility] Hematology/Oncology Physician [facility]  (Office):       [phone] (Work cell):  [phone] (Fax):           [phone]  10/27/2019 10:27 PM  I, Scot Dock, am acting as a scribe for Dr. Sullivan Lone.   .I have reviewed the above documentation for accuracy and completeness, and I agree with the above. Brunetta Genera MD

## 2019-10-28 ENCOUNTER — Inpatient Hospital Stay (HOSPITAL_BASED_OUTPATIENT_CLINIC_OR_DEPARTMENT_OTHER): Payer: Medicare Other | Admitting: Hematology

## 2019-10-28 DIAGNOSIS — E859 Amyloidosis, unspecified: Secondary | ICD-10-CM | POA: Diagnosis not present

## 2019-10-28 DIAGNOSIS — Z8546 Personal history of malignant neoplasm of prostate: Secondary | ICD-10-CM | POA: Diagnosis not present

## 2019-10-28 DIAGNOSIS — D472 Monoclonal gammopathy: Secondary | ICD-10-CM | POA: Diagnosis not present

## 2019-10-28 DIAGNOSIS — I10 Essential (primary) hypertension: Secondary | ICD-10-CM | POA: Diagnosis not present

## 2019-10-28 DIAGNOSIS — Z87891 Personal history of nicotine dependence: Secondary | ICD-10-CM | POA: Diagnosis not present

## 2019-11-08 DIAGNOSIS — E859 Amyloidosis, unspecified: Secondary | ICD-10-CM | POA: Diagnosis not present

## 2019-11-08 DIAGNOSIS — R0602 Shortness of breath: Secondary | ICD-10-CM | POA: Diagnosis not present

## 2019-11-08 DIAGNOSIS — I509 Heart failure, unspecified: Secondary | ICD-10-CM | POA: Diagnosis not present

## 2019-11-08 DIAGNOSIS — R062 Wheezing: Secondary | ICD-10-CM | POA: Diagnosis not present

## 2019-11-08 DIAGNOSIS — Z87891 Personal history of nicotine dependence: Secondary | ICD-10-CM | POA: Diagnosis not present

## 2019-11-08 DIAGNOSIS — I10 Essential (primary) hypertension: Secondary | ICD-10-CM | POA: Diagnosis not present

## 2019-11-08 DIAGNOSIS — R0601 Orthopnea: Secondary | ICD-10-CM | POA: Diagnosis not present

## 2019-11-21 MED FILL — VYNDAMAX 61 MG CAPS: 61 | 30 days supply | Qty: 30 | Fill #1

## 2019-11-28 ENCOUNTER — Telehealth (HOSPITAL_COMMUNITY): Payer: Self-pay

## 2019-11-28 NOTE — Telephone Encounter (Signed)

## 2019-11-29 ENCOUNTER — Other Ambulatory Visit: Payer: Self-pay

## 2019-11-29 ENCOUNTER — Ambulatory Visit (HOSPITAL_COMMUNITY)
Admission: RE | Admit: 2019-11-29 | Discharge: 2019-11-29 | Disposition: A | Discharge status: Home / Self Care | Payer: Medicare Other | Source: Ambulatory Visit | Attending: Cardiology | Admitting: Cardiology

## 2019-11-29 ENCOUNTER — Encounter (HOSPITAL_COMMUNITY): Payer: Self-pay | Admitting: Cardiology

## 2019-11-29 VITALS — BP 160/80 | HR 66 | Wt 155.4 lb

## 2019-11-29 DIAGNOSIS — I43 Cardiomyopathy in diseases classified elsewhere: Secondary | ICD-10-CM

## 2019-11-29 DIAGNOSIS — M1990 Unspecified osteoarthritis, unspecified site: Secondary | ICD-10-CM | POA: Diagnosis not present

## 2019-11-29 DIAGNOSIS — Z8546 Personal history of malignant neoplasm of prostate: Secondary | ICD-10-CM | POA: Insufficient documentation

## 2019-11-29 DIAGNOSIS — E785 Hyperlipidemia, unspecified: Secondary | ICD-10-CM | POA: Diagnosis not present

## 2019-11-29 DIAGNOSIS — I5022 Chronic systolic (congestive) heart failure: Secondary | ICD-10-CM

## 2019-11-29 DIAGNOSIS — Z79899 Other long term (current) drug therapy: Secondary | ICD-10-CM | POA: Insufficient documentation

## 2019-11-29 DIAGNOSIS — D472 Monoclonal gammopathy: Secondary | ICD-10-CM | POA: Diagnosis not present

## 2019-11-29 DIAGNOSIS — E859 Amyloidosis, unspecified: Secondary | ICD-10-CM | POA: Diagnosis not present

## 2019-11-29 DIAGNOSIS — H3530 Unspecified macular degeneration: Secondary | ICD-10-CM | POA: Insufficient documentation

## 2019-11-29 DIAGNOSIS — E854 Organ-limited amyloidosis: Secondary | ICD-10-CM | POA: Diagnosis not present

## 2019-11-29 DIAGNOSIS — I428 Other cardiomyopathies: Secondary | ICD-10-CM | POA: Insufficient documentation

## 2019-11-29 DIAGNOSIS — I110 Hypertensive heart disease with heart failure: Secondary | ICD-10-CM | POA: Diagnosis not present

## 2019-11-29 DIAGNOSIS — I509 Heart failure, unspecified: Secondary | ICD-10-CM | POA: Diagnosis present

## 2019-11-29 LAB — BASIC METABOLIC PANEL
Anion gap: 11 (ref 5–15)
BUN: 29 mg/dL — ABNORMAL HIGH (ref 8–23)
CO2: 25 mmol/L (ref 22–32)
Calcium: 9.7 mg/dL (ref 8.9–10.3)
Chloride: 104 mmol/L (ref 98–111)
Creatinine, Ser: 1.4 mg/dL — ABNORMAL HIGH (ref 0.61–1.24)
GFR calc Af Amer: 52 mL/min — ABNORMAL LOW (ref >60)
GFR calc non Af Amer: 45 mL/min — ABNORMAL LOW (ref >60)
Glucose, Bld: 95 mg/dL (ref 70–99)
Potassium: 4.8 mmol/L (ref 3.5–5.1)
Sodium: 140 mmol/L (ref 135–145)

## 2019-11-29 MED ORDER — SPIRONOLACTONE 25 MG PO TABS: 12.5000 mg | ORAL_TABLET | Freq: Every day | ORAL | 3 refills | Status: DC

## 2019-11-29 NOTE — Progress Notes (Signed)
PCP: Dr. Inda Merlin Cardiology: Dr. Aundra Dubin  86 y.o. returns for followup of CHF.  I took care of his wife in the past.  He had been generally health up until this fall.  He developed worsening fatigue as well as exertional dyspnea.  His PCP started him on Lasix 20 mg daily, which helped his breathing.  Echo was done (cannot review as done at non-[facility] facility) showing severe LVH with a speckled pattern concerning for amyloidosis.  Also noted was EF of 20%, diffuse hypokinesis, and moderate RV dysfunction.  LHC/RHC was done in 12/20, showing no significant CAD and optimized filling pressures with low cardiac output.    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    He saw Dr. Irene Limbo for evaluation of monoclonal gammopathy.  He would not want bone marrow biopsy or endomyocardial biopsy, and he would not want chemotherapy if multiple myeloma were found.  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   He is symptomatically stable.  He walks for about 30 minutes daily continuously. No dyspnea with this.  He lifts light weights. He works out about 1 hour total a day.  He has some generalized fatigue and does not feel like he has the stamina that he had in the past.  No peripheral edema, no orthopnea/PND. No chest pain. Weight is down 3 lbs.  BP is high today but generally is controlled (unusual).    Labs (12/20): K 5.2 => 4.9, creatinine 1.26 => 1.32, BNP 628, hgb 14.4.  Serum immunofixation with IgA monoclonal protein (light chain).  Urine immunofixation negative.  Labs (1/21): K 5.2 => 4.5, creatinine 1.31 => 1.26  PMH: 1. HTN 2. Hyperlipidemia 3. Prostate cancer 4. Osteoarthritis 5. Macular degeneration.  6. Chronic systolic CHF:  - Echo (50/03): EF 55-60%, normal RV.  - Echo (11/20): Severe LVH with speckled pattern concerning for amyloidosis, EF 20% with diffuse hypokinesis, moderately decreased RV systolic  function.  - LHC/RHC (12/20): No significant CAD.  Mean RA 2, PA 36/15, mean PCWP 14, CI 2.06.  - PYP scan: grade 3, H/CL ratio 2.8. Strongly suggestive of transthyretin amyloidosis.  [employer]ae gene testing negative for typical ATTR mutations, suggesting wild-type ATTR.  7. Monoclonal gammopathy  SH: Widower, retired, lives in [place].  Nonsmoker, occasional ETOH (not heavy).   FH: No history of premature CAD or cardiomyopathy.   ROS: All systems reviewed and negative except as per HPI.   Current Outpatient Medications  Medication Sig Dispense Refill  . carvedilol (COREG) 6.25 MG tablet Take 1 tablet (6.25 mg total) by mouth 2 (two) times daily. 60 tablet 3  . furosemide (LASIX) 20 MG tablet Take 1 tab every third day 15 tablet 5  . Glucosamine 500 MG CAPS Take 500 mg by mouth daily.     . Melatonin 5 MG TABS Take 5 mg by mouth daily.     . Multiple Vitamin (MULTIVITAMIN WITH MINERALS) TABS tablet Take 1 tablet by mouth daily.    . Multiple Vitamins-Minerals (PRESERVISION AREDS PO) Take 1 tablet by mouth daily.    . sacubitril-valsartan (ENTRESTO) 49-51 MG Take 1 tablet by mouth 2 (two) times daily. 60 tablet 5  . simvastatin (ZOCOR) 20 MG tablet Take 20 mg by mouth every morning.    . Tafamidis 61 MG CAPS Take 61 mg by mouth daily. 30 capsule 11  . spironolactone (ALDACTONE) 25 MG tablet Take 0.5 tablets (12.5 mg total) by mouth  daily. 15 tablet 3   No current facility-administered medications for this encounter.   BP (!) 160/80   Pulse 66   Wt 70.5 kg (155 lb 6.4 oz)   SpO2 97%   BMI 22.95 kg/m  General: NAD Neck: No JVD, no thyromegaly or thyroid nodule.  Lungs: Clear to auscultation bilaterally with normal respiratory effort. CV: Nondisplaced PMI.  Heart regular S1/S2, no S3/S4, no murmur.  No peripheral edema.  No carotid bruit.  Normal pedal pulses.  Abdomen: Soft, nontender, no hepatosplenomegaly, no distention.  Skin: Intact without lesions or rashes.  Neurologic: Alert  and oriented x 3.  Psych: Normal affect. Extremities: No clubbing or cyanosis.  HEENT: Normal.   Assessment/Plan: 1. Chronic systolic CHF: Nonischemic cardiomyopathy. Echo (11/20) with severe LVH and speckled pattern concerning for cardiac amyloidosis, EF 20%, moderately decreased RV systolic function.  LHC/RHC in 12/20 showed no significant CAD, filling pressures not elevated but cardiac output moderately depressed.  Appearance of myocardium is suggestive of amyloidosis, but this usually is not associated with a profound fall in EF. He does have symptoms that may be consistent with peripheral neuropathy.  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  NYHA class II symptoms, not volume overloaded on exam.  - Continue Entresto 49/51 bid, BMET today.  - Continue Coreg 6.25 mg bid.   - Start spironolactone 12.5 mg daily, check BMET today and again in 10 days.  - He can continue Lasix 20 mg every 3rd day.   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  Has some symptoms of peripheral neuropathy.  Myocardial appearance on echo is suggestive of amyloidosis.  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  However, serum immunofixation did show a monoclonal IgA light chain.  I suspect that this is ATTR amyloidosis given the strong positivity of the PYP scan.  He was seen by hematology (Dr. Irene Limbo) given the monoclonal light chain.  He does not want aggressive workup of the monoclonal gammopathy (no biopsies, would not want chemotherapy if multiple myeloma were found).  [employer]ae gene testing was negative for the common ATTR mutations, suggesting wild type ATTR amyloidosis.  Dr. 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  - Continue tafamidis.    Followup with HF pharmacist in 3 wks, hopefully can increase Entresto.  See me again in 6 wks.   Loralie Champagne 11/29/2019

## 2019-11-29 NOTE — Patient Instructions (Signed)
START Spironolactone 12.5mg  (1/2 tab) daily   Labs today and repeat in 10 days We will only contact you if something comes back abnormal or we need to make some changes. Otherwise no news is good news!   Your physician recommends that you schedule a follow-up appointment in: 3 weeks with the pharmacist for medication management and 6 weeks with Dr Aundra Dubin.     Please call office at [phone] option 2 if you have any questions or concerns.    At the [facility], you and your health needs are our priority. As part of our continuing mission to provide you with exceptional heart care, we have created designated Provider Care Teams. These Care Teams include your primary Cardiologist (physician) and Advanced Practice Providers (APPs- Physician Assistants and Nurse Practitioners) who all work together to provide you with the care you need, when you need it.   You may see any of the following providers on your designated Care Team at your next follow up: Marland Kitchen Dr Glori Bickers . Dr Loralie Champagne . Darrick Grinder, NP . Lyda Jester, PA . Audry Riles, PharmD   Please be sure to bring in all your medications bottles to every appointment.

## 2019-12-07 NOTE — Progress Notes (Signed)
PCP: Dr. Inda Merlin Cardiology: Dr. Aundra Dubin  HPI:  86 y.o. returns for followup of CHF.  He had been generally healthy up until this fall.  He developed worsening fatigue as well as exertional dyspnea.  His PCP started him on furosemide 20 mg daily, which helped his breathing.  Echo was done (cannot review as done at non-[facility] facility) showing severe LVH with a speckled pattern concerning for amyloidosis.  Also noted was EF of 20%, diffuse hypokinesis, and moderate RV dysfunction.  LHC/RHC was done in 12/20, showing no significant CAD and optimized filling pressures with low cardiac output.    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    He saw Dr. Irene Limbo for evaluation of monoclonal gammopathy.  He would not want bone marrow biopsy or endomyocardial biopsy, and he would not want chemotherapy if multiple myeloma were found.  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   Recently presented to HF Clinic for follow up. He was symptomatically stable.  He reported walking for about 30 minutes daily continuously. No dyspnea with this.  He reported lifting light weights. He worked out about 1 hour total a day.  He had some generalized fatigue and does not feel like he has the stamina that he had in the past.  No peripheral edema, no orthopnea/PND. No chest pain. Weight was down 3 lbs.  BP was high today but generally is controlled (unusual).    Today he returns to HF clinic for pharmacist medication titration. At last visit with MD, spironolactone 12.5 mg daily was initiated. Overall he is doing fine today. States he has both good days and bad days. On his bad days he feels more worn out and fatigued than his normal.  No dizziness, chest pain or palpitations. No DOE. He is still able to complete his 1 hour daily exercise regimen, though notes he is able to do less strenuous exercises now. Weight is stable. He takes  furosemide 20 mg every third day. No LEE, PND or orthopnea. Taking all medications as prescribed.   HF Medications: Carvedilol 6.25 mg BID Entresto 49/51 mg BID Spironolactone 12.5 mg daily Furosemide 20 mg every third day  Has the patient been experiencing any side effects to the medications prescribed?  no  Does the patient have any problems obtaining medications due to transportation or finances?   No - has Medicare Part D  Understanding of regimen: good Understanding of indications: good Potential of compliance: good Patient understands to avoid NSAIDs. Patient understands to avoid decongestants.    Pertinent Lab Values (12/09/19): Marland Kitchen Serum creatinine 1.44, BUN 35, Potassium 4.6, Sodium 140  Vital Signs: . Weight: 155.0 lbs (last clinic weight: 155.4 lbs) . Blood pressure: 158/90  . Heart rate: 62   Assessment: 1. Chronic systolic CHF: Nonischemic cardiomyopathy. Echo (11/20) with severe LVH and speckled pattern concerning for cardiac amyloidosis, EF 20%, moderately decreased RV systolic function.  LHC/RHC in 12/20 showed no significant CAD, filling pressures not elevated but cardiac output moderately depressed.  Appearance of myocardium is suggestive of amyloidosis, but this usually is not associated with a profound fall in EF. He does have symptoms that may be consistent with peripheral neuropathy.  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   -NYHA class II symptoms, not volume overloaded on exam.  - Continue furosemide 20 mg every 3rd day.  - Continue carvedilol 6.25 mg BID.   - Increase Entresto to  97/103 mg BID. Repeat BMET in 3 weeks.  - Continue spironolactone 12.5 mg daily. 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  Has some symptoms of peripheral neuropathy.  Myocardial appearance on echo is suggestive of amyloidosis.  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  However, serum immunofixation did show a monoclonal IgA light chain. Suspected  that this is ATTR amyloidosis given the strong positivity of the PYP scan.  He was seen by hematology (Dr. Irene Limbo) given the monoclonal light chain.  He did not want aggressive workup of the monoclonal gammopathy (no biopsies, would not want chemotherapy if multiple myeloma were found).  Invitae gene testing was negative for the common ATTR mutations, suggesting wild type ATTR amyloidosis.  Dr. 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  - Continue tafamidis.     Plan: 1) Medication changes: Based on clinical presentation, vital signs and recent labs will increase Entresto to 97/103 mg BID.  2) Follow-up: 3 weeks with Dr. Rush Farmer, PharmD, BCPS, [facility], CPP Heart Failure Clinic Pharmacist [phone]

## 2019-12-08 ENCOUNTER — Other Ambulatory Visit: Payer: Self-pay

## 2019-12-08 ENCOUNTER — Ambulatory Visit: Payer: Medicare Other | Admitting: Genetic Counselor

## 2019-12-09 ENCOUNTER — Ambulatory Visit (HOSPITAL_COMMUNITY)
Admission: RE | Admit: 2019-12-09 | Discharge: 2019-12-09 | Disposition: A | Discharge status: Home / Self Care | Payer: Medicare Other | Source: Ambulatory Visit | Attending: Internal Medicine | Admitting: Internal Medicine

## 2019-12-09 DIAGNOSIS — E859 Amyloidosis, unspecified: Secondary | ICD-10-CM | POA: Insufficient documentation

## 2019-12-09 LAB — BASIC METABOLIC PANEL
Anion gap: 9 (ref 5–15)
BUN: 35 mg/dL — ABNORMAL HIGH (ref 8–23)
CO2: 27 mmol/L (ref 22–32)
Calcium: 9.7 mg/dL (ref 8.9–10.3)
Chloride: 104 mmol/L (ref 98–111)
Creatinine, Ser: 1.44 mg/dL — ABNORMAL HIGH (ref 0.61–1.24)
GFR calc Af Amer: 50 mL/min — ABNORMAL LOW (ref >60)
GFR calc non Af Amer: 43 mL/min — ABNORMAL LOW (ref >60)
Glucose, Bld: 93 mg/dL (ref 70–99)
Potassium: 4.6 mmol/L (ref 3.5–5.1)
Sodium: 140 mmol/L (ref 135–145)

## 2019-12-15 MED FILL — VYNDAMAX 61 MG CAPS: 61 | 30 days supply | Qty: 30 | Fill #2

## 2019-12-19 NOTE — Progress Notes (Signed)
Post Test GC  Referring Provider: Sullivan Lone, MD   Referral Reason  Jawon Angello is referred for genetic consult subsequent to recent imaging that was strongly suggestive of TTR amyloidosis.  Genetic Consultation Notes  Balraj Vardeman is a very pleasant 86 year-old Caucasian gentleman. He reports having increase shortness of breath and fatigue over the last 3-6 months. He attributed this to his age until he underwent imaging studies that indicated cardiac amyloidosis.   I informed him that there are two main types of cardiac amyloidosis- AL Amyloidosis and Transthyretin-related amyloidosis. I explained to him that AL amyloidosis is an acquired disease that is caused by the accumulation of antibody light chains and typically involves more than one organ.   The second type of amyloidosis, namely transthyretin-related amyloidosis has two forms; one is an inherited type caused by mutations in the TTR gene that encodes transthyretin protein and, the second is a non-hereditary form called senile systemic amyloidosis caused by misfolding of the wild-type TTR protein.   Yasmin underwent genetic testing for TTR and was negative for mutations in the TTR gene. I informed him that a negative result indicates that he does not have the hereditary transthyretin-related amyloidosis. As such his children are not at risk of inheriting this condition. He was relieved to hear this.   His 4-generation family history was obtained. See attached pedigree.  Lattie Corns, Ph.D, [facility] Clinical Molecular Geneticist

## 2019-12-20 DIAGNOSIS — L570 Actinic keratosis: Secondary | ICD-10-CM | POA: Diagnosis not present

## 2019-12-20 DIAGNOSIS — Z85828 Personal history of other malignant neoplasm of skin: Secondary | ICD-10-CM | POA: Diagnosis not present

## 2019-12-20 DIAGNOSIS — L821 Other seborrheic keratosis: Secondary | ICD-10-CM | POA: Diagnosis not present

## 2019-12-22 ENCOUNTER — Ambulatory Visit (HOSPITAL_COMMUNITY)
Admission: RE | Admit: 2019-12-22 | Discharge: 2019-12-22 | Disposition: A | Discharge status: Home / Self Care | Payer: Medicare Other | Source: Ambulatory Visit | Attending: Cardiology | Admitting: Cardiology

## 2019-12-22 ENCOUNTER — Other Ambulatory Visit: Payer: Self-pay

## 2019-12-22 VITALS — BP 158/90 | HR 62 | Wt 155.0 lb

## 2019-12-22 DIAGNOSIS — Z7901 Long term (current) use of anticoagulants: Secondary | ICD-10-CM | POA: Insufficient documentation

## 2019-12-22 DIAGNOSIS — Z79899 Other long term (current) drug therapy: Secondary | ICD-10-CM | POA: Diagnosis not present

## 2019-12-22 DIAGNOSIS — R5383 Other fatigue: Secondary | ICD-10-CM | POA: Diagnosis not present

## 2019-12-22 DIAGNOSIS — I428 Other cardiomyopathies: Secondary | ICD-10-CM | POA: Insufficient documentation

## 2019-12-22 DIAGNOSIS — I5022 Chronic systolic (congestive) heart failure: Secondary | ICD-10-CM | POA: Diagnosis not present

## 2019-12-22 MED ORDER — SACUBITRIL-VALSARTAN 97-103 MG PO TABS: 1.0000 | ORAL_TABLET | Freq: Two times a day (BID) | ORAL | 11 refills | Status: DC

## 2019-12-22 NOTE — Patient Instructions (Signed)
It was a pleasure seeing you today!  MEDICATIONS: -We are changing your medications today -Increase Entresto to 97/103 mg (1 tablet) twice daily. You may take 2 tablets of the 49/51 mg strength twice daily until you pick up the new prescription. -Call if you have questions about your medications.  NEXT APPOINTMENT: Return to clinic in 3 weeks with Dr. Aundra Dubin.  In general, to take care of your heart failure: -Limit your fluid intake to 2 Liters (half-gallon) per day.   -Limit your salt intake to ideally 2-3 grams (2000-3000 mg) per day. -Weigh yourself daily and record, and bring that "weight diary" to your next appointment.  (Weight gain of 2-3 pounds in 1 day typically means fluid weight.) -The medications for your heart are to help your heart and help you live longer.   -Please contact us before stopping any of your heart medications.  Call the clinic at [phone] with questions or to reschedule future appointments.

## 2020-01-11 ENCOUNTER — Encounter (HOSPITAL_COMMUNITY): Payer: Self-pay | Admitting: Cardiology

## 2020-01-11 ENCOUNTER — Ambulatory Visit (HOSPITAL_COMMUNITY)
Admission: RE | Admit: 2020-01-11 | Discharge: 2020-01-11 | Disposition: A | Discharge status: Home / Self Care | Payer: Medicare Other | Source: Ambulatory Visit | Attending: Cardiology | Admitting: Cardiology

## 2020-01-11 ENCOUNTER — Other Ambulatory Visit: Payer: Self-pay

## 2020-01-11 VITALS — BP 144/82 | HR 76 | Wt 155.4 lb

## 2020-01-11 DIAGNOSIS — I5022 Chronic systolic (congestive) heart failure: Secondary | ICD-10-CM | POA: Diagnosis not present

## 2020-01-11 DIAGNOSIS — E785 Hyperlipidemia, unspecified: Secondary | ICD-10-CM | POA: Diagnosis not present

## 2020-01-11 DIAGNOSIS — Z8546 Personal history of malignant neoplasm of prostate: Secondary | ICD-10-CM | POA: Diagnosis not present

## 2020-01-11 DIAGNOSIS — Z79899 Other long term (current) drug therapy: Secondary | ICD-10-CM | POA: Insufficient documentation

## 2020-01-11 DIAGNOSIS — I110 Hypertensive heart disease with heart failure: Secondary | ICD-10-CM | POA: Insufficient documentation

## 2020-01-11 DIAGNOSIS — E859 Amyloidosis, unspecified: Secondary | ICD-10-CM

## 2020-01-11 DIAGNOSIS — I428 Other cardiomyopathies: Secondary | ICD-10-CM | POA: Insufficient documentation

## 2020-01-11 LAB — BASIC METABOLIC PANEL
Anion gap: 9 (ref 5–15)
BUN: 36 mg/dL — ABNORMAL HIGH (ref 8–23)
CO2: 25 mmol/L (ref 22–32)
Calcium: 9.4 mg/dL (ref 8.9–10.3)
Chloride: 106 mmol/L (ref 98–111)
Creatinine, Ser: 1.31 mg/dL — ABNORMAL HIGH (ref 0.61–1.24)
GFR calc Af Amer: 56 mL/min — ABNORMAL LOW (ref >60)
GFR calc non Af Amer: 48 mL/min — ABNORMAL LOW (ref >60)
Glucose, Bld: 100 mg/dL — ABNORMAL HIGH (ref 70–99)
Potassium: 4.9 mmol/L (ref 3.5–5.1)
Sodium: 140 mmol/L (ref 135–145)

## 2020-01-11 MED ORDER — SPIRONOLACTONE 25 MG PO TABS: 25.0000 mg | ORAL_TABLET | Freq: Every day | ORAL | 5 refills | Status: DC

## 2020-01-11 NOTE — Patient Instructions (Signed)
INCREASE Spironolactone to 25mg  (1 tab) daily   Labs today and repeat in 10 days We will only contact you if something comes back abnormal or we need to make some changes. Otherwise no news is good news!   Your physician recommends that you schedule a follow-up appointment in: 3 months with Dr Aundra Dubin  Please call office at [phone] option [phone] if you have any questions or concerns.    At the [facility], you and your health needs are our priority. As part of our continuing mission to provide you with exceptional heart care, we have created designated Provider Care Teams. These Care Teams include your primary Cardiologist (physician) and Advanced Practice Providers (APPs- Physician Assistants and Nurse Practitioners) who all work together to provide you with the care you need, when you need it.   You may see any of the following providers on your designated Care Team at your next follow up: Marland Kitchen Dr Glori Bickers . Dr Loralie Champagne . Darrick Grinder, NP . Lyda Jester, PA . Audry Riles, PharmD   Please be sure to bring in all your medications bottles to every appointment.

## 2020-01-11 NOTE — Progress Notes (Signed)
PCP: Dr. Inda Merlin Cardiology: Dr. Aundra Dubin  86 y.o. returns for followup of CHF.  I took care of his wife in the past.  He had been generally health up until this fall.  He developed worsening fatigue as well as exertional dyspnea.  His PCP started him on Lasix 20 mg daily, which helped his breathing.  Echo was done (cannot review as done at non-[facility] facility) showing severe LVH with a speckled pattern concerning for amyloidosis.  Also noted was EF of 20%, diffuse hypokinesis, and moderate RV dysfunction.  LHC/RHC was done in 12/20, showing no significant CAD and optimized filling pressures with low cardiac output.    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    He saw Dr. Irene Limbo for evaluation of monoclonal gammopathy.  He would not want bone marrow biopsy or endomyocardial biopsy, and he would not want chemotherapy if multiple myeloma were found.  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   He is symptomatically stable. Weight is stable.  He works out with [employer] and walks for around her house for exercise for about 30 minutes every day. He has good days and bad days, but generally not short of breath on flat ground.  He does fatigue more easily than in the past.  No chest pain.  No lightheadedness. No orthopnea/PND.      Labs (12/20): K 5.2 => 4.9, creatinine 1.26 => 1.32, BNP 628, hgb 14.4.  Serum immunofixation with IgA monoclonal protein (light chain).  Urine immunofixation negative.  Labs (1/21): K 5.2 => 4.5, creatinine 1.31 => 1.26 Labs (3/21): K 4.6, creatinine 1.44  PMH: 1. HTN 2. Hyperlipidemia 3. Prostate cancer 4. Osteoarthritis 5. Macular degeneration.  6. Chronic systolic CHF:  - Echo (16/10): EF 55-60%, normal RV.  - Echo (11/20): Severe LVH with speckled pattern concerning for amyloidosis, EF 20% with diffuse hypokinesis, moderately decreased RV systolic function.  - LHC/RHC (12/20): No  significant CAD.  Mean RA 2, PA 36/15, mean PCWP 14, CI 2.06.  - PYP scan: grade 3, H/CL ratio 2.8. Strongly suggestive of transthyretin amyloidosis.  Invitae gene testing negative for typical ATTR mutations, suggesting wild-type ATTR.  7. Monoclonal gammopathy  SH: Widower, retired, lives in [place].  Nonsmoker, occasional ETOH (not heavy).   FH: No history of premature CAD or cardiomyopathy.   ROS: All systems reviewed and negative except as per HPI.   Current Outpatient Medications  Medication Sig Dispense Refill  . carvedilol (COREG) 6.25 MG tablet Take 1 tablet (6.25 mg total) by mouth 2 (two) times daily. 60 tablet 3  . furosemide (LASIX) 20 MG tablet Take 1 tab every third day 15 tablet 5  . Glucosamine 500 MG CAPS Take 500 mg by mouth daily.     . Melatonin 5 MG TABS Take 5 mg by mouth daily.     . Multiple Vitamin (MULTIVITAMIN WITH MINERALS) TABS tablet Take 1 tablet by mouth daily.    . Multiple Vitamins-Minerals (PRESERVISION AREDS PO) Take 1 tablet by mouth daily.    . sacubitril-valsartan (ENTRESTO) 97-103 MG Take 1 tablet by mouth 2 (two) times daily. 60 tablet 11  . simvastatin (ZOCOR) 20 MG tablet Take 20 mg by mouth every morning.    Marland Kitchen spironolactone (ALDACTONE) 25 MG tablet Take 1 tablet (25 mg total) by mouth daily. 30 tablet 5  . Tafamidis 61 MG CAPS Take 61 mg by mouth daily. 30 capsule 11  No current facility-administered medications for this encounter.   BP (!) 144/82   Pulse 76   Wt 70.5 kg (155 lb 6.4 oz)   SpO2 98%   BMI 22.95 kg/m  General: NAD Neck: No JVD, no thyromegaly or thyroid nodule.  Lungs: Clear to auscultation bilaterally with normal respiratory effort. CV: Nondisplaced PMI.  Heart regular S1/S2, no S3/S4, no murmur.  No peripheral edema.  No carotid bruit.  Normal pedal pulses.  Abdomen: Soft, nontender, no hepatosplenomegaly, no distention.  Skin: Intact without lesions or rashes.  Neurologic: Alert and oriented x 3.  Psych: Normal  affect. Extremities: No clubbing or cyanosis.  HEENT: Normal.   Assessment/Plan: 1. Chronic systolic CHF: Nonischemic cardiomyopathy. Echo (11/20) with severe LVH and speckled pattern concerning for cardiac amyloidosis, EF 20%, moderately decreased RV systolic function.  LHC/RHC in 12/20 showed no significant CAD, filling pressures not elevated but cardiac output moderately depressed.  Appearance of myocardium is suggestive of amyloidosis, but this usually is not associated with a profound fall in EF. He does have symptoms that may be consistent with peripheral neuropathy.  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  NYHA class II symptoms, not volume overloaded on exam.  - Continue Entresto 49/51 bid. - Continue Coreg 6.25 mg bid.   - Increase spironolactone to 25 mg daily with BMET today and in 10 days.  - He can continue Lasix 20 mg every 3rd day.   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  Has some symptoms of peripheral neuropathy.  Myocardial appearance on echo is suggestive of amyloidosis.  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  However, serum immunofixation did show a monoclonal IgA light chain.  I suspect that this is ATTR amyloidosis given the strong positivity of the PYP scan.  He was seen by hematology (Dr. Irene Limbo) given the monoclonal light chain.  He does not want aggressive workup of the monoclonal gammopathy (no biopsies, would not want chemotherapy if multiple myeloma were found).  Invitae gene testing was negative for the common ATTR mutations, suggesting wild type ATTR amyloidosis.  Dr. 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  - Continue tafamidis.    Followup in 3 months.   Loralie Champagne 01/11/2020

## 2020-01-18 MED FILL — VYNDAMAX 61 MG CAPS: 61 | 30 days supply | Qty: 30 | Fill #3

## 2020-01-19 DIAGNOSIS — M47817 Spondylosis without myelopathy or radiculopathy, lumbosacral region: Secondary | ICD-10-CM | POA: Diagnosis not present

## 2020-01-19 DIAGNOSIS — S29012A Strain of muscle and tendon of back wall of thorax, initial encounter: Secondary | ICD-10-CM | POA: Diagnosis not present

## 2020-01-19 DIAGNOSIS — M9902 Segmental and somatic dysfunction of thoracic region: Secondary | ICD-10-CM | POA: Diagnosis not present

## 2020-01-19 DIAGNOSIS — M5136 Other intervertebral disc degeneration, lumbar region: Secondary | ICD-10-CM | POA: Diagnosis not present

## 2020-01-19 DIAGNOSIS — M9905 Segmental and somatic dysfunction of pelvic region: Secondary | ICD-10-CM | POA: Diagnosis not present

## 2020-01-19 DIAGNOSIS — M9903 Segmental and somatic dysfunction of lumbar region: Secondary | ICD-10-CM | POA: Diagnosis not present

## 2020-01-24 ENCOUNTER — Other Ambulatory Visit: Payer: Self-pay

## 2020-01-24 ENCOUNTER — Ambulatory Visit (HOSPITAL_COMMUNITY)
Admission: RE | Admit: 2020-01-24 | Discharge: 2020-01-24 | Disposition: A | Discharge status: Home / Self Care | Payer: Medicare Other | Source: Ambulatory Visit | Attending: Cardiology | Admitting: Cardiology

## 2020-01-24 DIAGNOSIS — E859 Amyloidosis, unspecified: Secondary | ICD-10-CM | POA: Insufficient documentation

## 2020-01-24 LAB — BASIC METABOLIC PANEL
Anion gap: 8 (ref 5–15)
BUN: 40 mg/dL — ABNORMAL HIGH (ref 8–23)
CO2: 28 mmol/L (ref 22–32)
Calcium: 9.4 mg/dL (ref 8.9–10.3)
Chloride: 106 mmol/L (ref 98–111)
Creatinine, Ser: 1.54 mg/dL — ABNORMAL HIGH (ref 0.61–1.24)
GFR calc Af Amer: 46 mL/min — ABNORMAL LOW (ref >60)
GFR calc non Af Amer: 40 mL/min — ABNORMAL LOW (ref >60)
Glucose, Bld: 93 mg/dL (ref 70–99)
Potassium: 5.1 mmol/L (ref 3.5–5.1)
Sodium: 142 mmol/L (ref 135–145)

## 2020-01-25 ENCOUNTER — Telehealth (HOSPITAL_COMMUNITY): Payer: Self-pay | Admitting: *Deleted

## 2020-01-25 DIAGNOSIS — S29012A Strain of muscle and tendon of back wall of thorax, initial encounter: Secondary | ICD-10-CM | POA: Diagnosis not present

## 2020-01-25 DIAGNOSIS — M5136 Other intervertebral disc degeneration, lumbar region: Secondary | ICD-10-CM | POA: Diagnosis not present

## 2020-01-25 DIAGNOSIS — M9902 Segmental and somatic dysfunction of thoracic region: Secondary | ICD-10-CM | POA: Diagnosis not present

## 2020-01-25 DIAGNOSIS — M47817 Spondylosis without myelopathy or radiculopathy, lumbosacral region: Secondary | ICD-10-CM | POA: Diagnosis not present

## 2020-01-25 DIAGNOSIS — M9905 Segmental and somatic dysfunction of pelvic region: Secondary | ICD-10-CM | POA: Diagnosis not present

## 2020-01-25 DIAGNOSIS — I5022 Chronic systolic (congestive) heart failure: Secondary | ICD-10-CM

## 2020-01-25 DIAGNOSIS — M9903 Segmental and somatic dysfunction of lumbar region: Secondary | ICD-10-CM | POA: Diagnosis not present

## 2020-01-25 NOTE — Telephone Encounter (Signed)
Harvie Junior, [place]  01/25/2020 10:48 AM EDT    Pt returned call he is aware and agreeable with plan. Lab appt scheduled.    Valeda Malm, RN  01/25/2020 9:32 AM EDT    LM for patient to return call   Larey Dresser, MD  01/24/2020 11:43 PM EDT    Stop Lasix, follow very low K diet, repeat BMET 1 week with mildly higher BUN/creatinine and upper normal K.

## 2020-01-27 DIAGNOSIS — M5136 Other intervertebral disc degeneration, lumbar region: Secondary | ICD-10-CM | POA: Diagnosis not present

## 2020-01-27 DIAGNOSIS — M9905 Segmental and somatic dysfunction of pelvic region: Secondary | ICD-10-CM | POA: Diagnosis not present

## 2020-01-27 DIAGNOSIS — M47817 Spondylosis without myelopathy or radiculopathy, lumbosacral region: Secondary | ICD-10-CM | POA: Diagnosis not present

## 2020-01-27 DIAGNOSIS — M9903 Segmental and somatic dysfunction of lumbar region: Secondary | ICD-10-CM | POA: Diagnosis not present

## 2020-01-27 DIAGNOSIS — M9902 Segmental and somatic dysfunction of thoracic region: Secondary | ICD-10-CM | POA: Diagnosis not present

## 2020-01-27 DIAGNOSIS — S29012A Strain of muscle and tendon of back wall of thorax, initial encounter: Secondary | ICD-10-CM | POA: Diagnosis not present

## 2020-01-30 DIAGNOSIS — M9905 Segmental and somatic dysfunction of pelvic region: Secondary | ICD-10-CM | POA: Diagnosis not present

## 2020-01-30 DIAGNOSIS — M47817 Spondylosis without myelopathy or radiculopathy, lumbosacral region: Secondary | ICD-10-CM | POA: Diagnosis not present

## 2020-01-30 DIAGNOSIS — S29012A Strain of muscle and tendon of back wall of thorax, initial encounter: Secondary | ICD-10-CM | POA: Diagnosis not present

## 2020-01-30 DIAGNOSIS — M9902 Segmental and somatic dysfunction of thoracic region: Secondary | ICD-10-CM | POA: Diagnosis not present

## 2020-01-30 DIAGNOSIS — M5136 Other intervertebral disc degeneration, lumbar region: Secondary | ICD-10-CM | POA: Diagnosis not present

## 2020-01-30 DIAGNOSIS — M9903 Segmental and somatic dysfunction of lumbar region: Secondary | ICD-10-CM | POA: Diagnosis not present

## 2020-02-01 ENCOUNTER — Ambulatory Visit (HOSPITAL_COMMUNITY)
Admission: RE | Admit: 2020-02-01 | Discharge: 2020-02-01 | Disposition: A | Discharge status: Home / Self Care | Payer: Medicare Other | Source: Ambulatory Visit | Attending: Cardiology | Admitting: Cardiology

## 2020-02-01 ENCOUNTER — Other Ambulatory Visit: Payer: Self-pay

## 2020-02-01 DIAGNOSIS — I5022 Chronic systolic (congestive) heart failure: Secondary | ICD-10-CM | POA: Diagnosis not present

## 2020-02-01 LAB — BASIC METABOLIC PANEL
Anion gap: 7 (ref 5–15)
BUN: 32 mg/dL — ABNORMAL HIGH (ref 8–23)
CO2: 24 mmol/L (ref 22–32)
Calcium: 9.3 mg/dL (ref 8.9–10.3)
Chloride: 109 mmol/L (ref 98–111)
Creatinine, Ser: 1.38 mg/dL — ABNORMAL HIGH (ref 0.61–1.24)
GFR calc Af Amer: 53 mL/min — ABNORMAL LOW (ref >60)
GFR calc non Af Amer: 45 mL/min — ABNORMAL LOW (ref >60)
Glucose, Bld: 103 mg/dL — ABNORMAL HIGH (ref 70–99)
Potassium: 4.8 mmol/L (ref 3.5–5.1)
Sodium: 140 mmol/L (ref 135–145)

## 2020-02-16 MED FILL — VYNDAMAX 61 MG CAPS: 61 | 30 days supply | Qty: 30 | Fill #4

## 2020-03-05 ENCOUNTER — Telehealth (HOSPITAL_COMMUNITY): Payer: Self-pay | Admitting: Pharmacy Technician

## 2020-03-05 NOTE — Telephone Encounter (Signed)
Patient Advocate Encounter   Received notification from [place] [employer] that prior authorization for Vyndamax is required.   PA submitted on CoverMyMeds Key [id number] Status is pending   Will continue to follow.

## 2020-03-07 ENCOUNTER — Other Ambulatory Visit (HOSPITAL_COMMUNITY): Payer: Self-pay | Admitting: Cardiology

## 2020-03-07 DIAGNOSIS — I5022 Chronic systolic (congestive) heart failure: Secondary | ICD-10-CM

## 2020-03-14 DIAGNOSIS — H52203 Unspecified astigmatism, bilateral: Secondary | ICD-10-CM | POA: Diagnosis not present

## 2020-03-14 DIAGNOSIS — H35371 Puckering of macula, right eye: Secondary | ICD-10-CM | POA: Diagnosis not present

## 2020-03-14 DIAGNOSIS — Z961 Presence of intraocular lens: Secondary | ICD-10-CM | POA: Diagnosis not present

## 2020-03-19 ENCOUNTER — Telehealth (HOSPITAL_COMMUNITY): Payer: Self-pay | Admitting: Pharmacist

## 2020-03-19 MED ORDER — TAFAMIDIS 61 MG PO CAPS: 61.0000 mg | ORAL_CAPSULE | Freq: Every day | ORAL | 11 refills | Status: DC

## 2020-03-19 NOTE — Telephone Encounter (Signed)
Advanced Heart Failure Patient Advocate Encounter  Prior Authorization for Vyndamax has been approved (Film/video editor).     Effective dates: 03/14/20 through 03/14/21  Have been in touch with [employer] in regards to obtaining a co-pay card. Advocate will call back once approved.  Will follow up.

## 2020-03-19 NOTE — Telephone Encounter (Signed)
Vyndamax prescription sent to [facility].

## 2020-03-20 NOTE — Telephone Encounter (Signed)
Spoke with patient, he provided me with the co-pay card information. I called [facility] and gave them billing information. His Vyndamax will now be filled with [facility], due to insurance requirements.  ID [id number]  BIN [id number]  PCN [id number]  GROUP [id number]  Monthly co-pay will be $100. Patient is aware that the pharmacy will reach out to him to schedule his shipment and obtain his debit/credit card information.  I advised him to call me with any issues or if he doesn't hear from the pharmacy.  Charlann Boxer, CPhT

## 2020-04-06 ENCOUNTER — Encounter (HOSPITAL_COMMUNITY): Payer: Self-pay | Admitting: Cardiology

## 2020-04-06 ENCOUNTER — Ambulatory Visit (HOSPITAL_COMMUNITY)
Admission: RE | Admit: 2020-04-06 | Discharge: 2020-04-06 | Disposition: A | Discharge status: Home / Self Care | Payer: Medicare Other | Source: Ambulatory Visit | Attending: Cardiology | Admitting: Cardiology

## 2020-04-06 ENCOUNTER — Other Ambulatory Visit: Payer: Self-pay

## 2020-04-06 VITALS — BP 110/62 | HR 58 | Wt 155.0 lb

## 2020-04-06 DIAGNOSIS — I110 Hypertensive heart disease with heart failure: Secondary | ICD-10-CM | POA: Diagnosis not present

## 2020-04-06 DIAGNOSIS — E785 Hyperlipidemia, unspecified: Secondary | ICD-10-CM | POA: Insufficient documentation

## 2020-04-06 DIAGNOSIS — G629 Polyneuropathy, unspecified: Secondary | ICD-10-CM | POA: Diagnosis not present

## 2020-04-06 DIAGNOSIS — D472 Monoclonal gammopathy: Secondary | ICD-10-CM | POA: Diagnosis not present

## 2020-04-06 DIAGNOSIS — I5022 Chronic systolic (congestive) heart failure: Secondary | ICD-10-CM | POA: Insufficient documentation

## 2020-04-06 DIAGNOSIS — I428 Other cardiomyopathies: Secondary | ICD-10-CM | POA: Diagnosis not present

## 2020-04-06 DIAGNOSIS — Z8546 Personal history of malignant neoplasm of prostate: Secondary | ICD-10-CM | POA: Diagnosis not present

## 2020-04-06 DIAGNOSIS — Z79899 Other long term (current) drug therapy: Secondary | ICD-10-CM | POA: Insufficient documentation

## 2020-04-06 DIAGNOSIS — M1990 Unspecified osteoarthritis, unspecified site: Secondary | ICD-10-CM | POA: Diagnosis not present

## 2020-04-06 DIAGNOSIS — Z7984 Long term (current) use of oral hypoglycemic drugs: Secondary | ICD-10-CM | POA: Insufficient documentation

## 2020-04-06 DIAGNOSIS — E859 Amyloidosis, unspecified: Secondary | ICD-10-CM | POA: Diagnosis not present

## 2020-04-06 LAB — BASIC METABOLIC PANEL
Anion gap: 8 (ref 5–15)
BUN: 47 mg/dL — ABNORMAL HIGH (ref 8–23)
CO2: 22 mmol/L (ref 22–32)
Calcium: 9.3 mg/dL (ref 8.9–10.3)
Chloride: 107 mmol/L (ref 98–111)
Creatinine, Ser: 1.54 mg/dL — ABNORMAL HIGH (ref 0.61–1.24)
GFR calc Af Amer: 46 mL/min — ABNORMAL LOW (ref >60)
GFR calc non Af Amer: 40 mL/min — ABNORMAL LOW (ref >60)
Glucose, Bld: 109 mg/dL — ABNORMAL HIGH (ref 70–99)
Potassium: 5.4 mmol/L — ABNORMAL HIGH (ref 3.5–5.1)
Sodium: 137 mmol/L (ref 135–145)

## 2020-04-06 MED ORDER — DAPAGLIFLOZIN PROPANEDIOL 10 MG PO TABS
10.0000 mg | ORAL_TABLET | Freq: Every day | ORAL | 11 refills | Status: DC
Start: 2020-04-06 — End: 2021-03-27

## 2020-04-06 NOTE — Patient Instructions (Signed)
START Farxiga 10 mg, one tab daily  Labs today We will only contact you if something comes back abnormal or we need to make some changes. Otherwise no news is good news!  Labs needed in 7-10 days  Your physician recommends that you schedule a follow-up appointment in: 3 months with Dr Aundra Dubin  Do the following things EVERYDAY: 1) Weigh yourself in the morning before breakfast. Write it down and keep it in a log. 2) Take your medicines as prescribed 3) Eat low salt foods--Limit salt (sodium) to 2000 mg per day.  4) Stay as active as you can everyday 5) Limit all fluids for the day to less than 2 liters  At the [facility], you and your health needs are our priority. As part of our continuing mission to provide you with exceptional heart care, we have created designated Provider Care Teams. These Care Teams include your primary Cardiologist (physician) and Advanced Practice Providers (APPs- Physician Assistants and Nurse Practitioners) who all work together to provide you with the care you need, when you need it.   You may see any of the following providers on your designated Care Team at your next follow up: Marland Kitchen Dr Glori Bickers . Dr Loralie Champagne . Darrick Grinder, NP . Lyda Jester, PA . Audry Riles, PharmD   Please be sure to bring in all your medications bottles to every appointment.

## 2020-04-07 NOTE — Progress Notes (Signed)
PCP: Dr. Inda Merlin Cardiology: Dr. Aundra Dubin  86 y.o. returns for followup of CHF.  I took care of his wife in the past.  He had been generally health up until this fall.  He developed worsening fatigue as well as exertional dyspnea.  His PCP started him on Lasix 20 mg daily, which helped his breathing.  Echo was done (cannot review as done at non-[facility] facility) showing severe LVH with a speckled pattern concerning for amyloidosis.  Also noted was EF of 20%, diffuse hypokinesis, and moderate RV dysfunction.  LHC/RHC was done in 12/20, showing no significant CAD and optimized filling pressures with low cardiac output.    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    He saw Dr. Irene Limbo for evaluation of monoclonal gammopathy.  He would not want bone marrow biopsy or endomyocardial biopsy, and he would not want chemotherapy if multiple myeloma were found.  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   He is symptomatically stable. Weight is stable.  He works out with [employer] and walks for around her house for exercise for about 30 minutes every day. He has good days and bad days, but generally no dyspnea walking on flat ground.  More fatigue than in the past, has to take rest breaks when he does yardwork.  No falls, rare lightheadedness if he stands too fast.  No orthopnea/PND.  Occasional pain/tingling in his feet that may represent neuropathy, but symptoms not prominent.   He is planning to go to the beach for a week with his family soon.     Labs (12/20): K 5.2 => 4.9, creatinine 1.26 => 1.32, BNP 628, hgb 14.4.  Serum immunofixation with IgA monoclonal protein (light chain).  Urine immunofixation negative.  Labs (1/21): K 5.2 => 4.5, creatinine 1.31 => 1.26 Labs (3/21): K 4.6, creatinine 1.44 Labs (5/21): K 4.8, creatinine 1.38  ECG (personally reviewed): NSR, 1st degree AVB  PMH: 1. HTN 2. Hyperlipidemia 3.  Prostate cancer 4. Osteoarthritis 5. Macular degeneration.  6. Chronic systolic CHF:  - Echo (83/38): EF 55-60%, normal RV.  - Echo (11/20): Severe LVH with speckled pattern concerning for amyloidosis, EF 20% with diffuse hypokinesis, moderately decreased RV systolic function.  - LHC/RHC (12/20): No significant CAD.  Mean RA 2, PA 36/15, mean PCWP 14, CI 2.06.  - PYP scan: grade 3, H/CL ratio 2.8. Strongly suggestive of transthyretin amyloidosis.  Invitae gene testing negative for typical ATTR mutations, suggesting wild-type ATTR.  7. Monoclonal gammopathy  SH: Widower, retired, lives in [place].  Nonsmoker, occasional ETOH (not heavy).   FH: No history of premature CAD or cardiomyopathy.   ROS: All systems reviewed and negative except as per HPI.   Current Outpatient Medications  Medication Sig Dispense Refill  . carvedilol (COREG) 6.25 MG tablet TAKE 1 TABLET BY MOUTH TWICE DAILY. 180 tablet 3  . Glucosamine 500 MG CAPS Take 500 mg by mouth daily.     . Melatonin 5 MG TABS Take 5 mg by mouth daily.     . Multiple Vitamin (MULTIVITAMIN WITH MINERALS) TABS tablet Take 1 tablet by mouth daily.    . Multiple Vitamins-Minerals (PRESERVISION AREDS PO) Take 1 tablet by mouth daily.    . sacubitril-valsartan (ENTRESTO) 97-103 MG Take 1 tablet by mouth 2 (two) times daily. 60 tablet 11  . simvastatin (ZOCOR) 20 MG tablet Take 20 mg by mouth every morning.    Marland Kitchen spironolactone (ALDACTONE)  25 MG tablet Take 1 tablet (25 mg total) by mouth daily. 30 tablet 5  . Tafamidis 61 MG CAPS Take 61 mg by mouth daily. 30 capsule 11  . dapagliflozin propanediol (FARXIGA) 10 MG TABS tablet Take 1 tablet (10 mg total) by mouth daily before breakfast. 30 tablet 11   No current facility-administered medications for this encounter.   BP 110/62   Pulse (!) 58   Wt 70.3 kg (155 lb)   SpO2 98%   BMI 22.89 kg/m  General: NAD Neck: No JVD, no thyromegaly or thyroid nodule.  Lungs: Clear to auscultation  bilaterally with normal respiratory effort. CV: Nondisplaced PMI.  Heart regular S1/S2, no S3/S4, no murmur.  No peripheral edema.  No carotid bruit.  Normal pedal pulses.  Abdomen: Soft, nontender, no hepatosplenomegaly, no distention.  Skin: Intact without lesions or rashes.  Neurologic: Alert and oriented x 3.  Psych: Normal affect. Extremities: No clubbing or cyanosis.  HEENT: Normal.   Assessment/Plan: 1. Chronic systolic CHF: Nonischemic cardiomyopathy. Echo (11/20) with severe LVH and speckled pattern concerning for cardiac amyloidosis, EF 20%, moderately decreased RV systolic function.  LHC/RHC in 12/20 showed no significant CAD, filling pressures not elevated but cardiac output moderately depressed.  Appearance of myocardium is suggestive of amyloidosis, but this usually is not associated with a profound fall in EF. He does have symptoms that may be consistent with mild peripheral neuropathy.  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  NYHA class II symptoms, not volume overloaded on exam.  - Continue Entresto 97/103 bid. - Continue Coreg 6.25 mg bid, will not increase with HR in 50s.   - Continue spironolactone.  - He is not on Lasix.  - Add Farxiga 10 mg daily. BMET today and in 10 days.  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  Has some symptoms of peripheral neuropathy.  Myocardial appearance on echo is suggestive of amyloidosis.  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  However, serum immunofixation did show a monoclonal IgA light chain.  I suspect that this is ATTR amyloidosis given the strong positivity of the PYP scan.  He was seen by hematology (Dr. Irene Limbo) given the monoclonal light chain.  He does not want aggressive workup of the monoclonal gammopathy (no biopsies, would not want chemotherapy if multiple myeloma were found).  Invitae gene testing was negative for the common ATTR mutations, suggesting wild type ATTR amyloidosis.  Dr. Irene Limbo also  thinks that he likely has ATTR amyloidosis.  - Continue tafamidis.    Followup in 3 months.   Loralie Champagne 04/07/2020

## 2020-04-13 ENCOUNTER — Other Ambulatory Visit: Payer: Self-pay

## 2020-04-13 ENCOUNTER — Ambulatory Visit (HOSPITAL_COMMUNITY)
Admission: RE | Admit: 2020-04-13 | Discharge: 2020-04-13 | Disposition: A | Discharge status: Home / Self Care | Payer: Medicare Other | Source: Ambulatory Visit | Attending: Cardiology | Admitting: Cardiology

## 2020-04-13 ENCOUNTER — Other Ambulatory Visit (HOSPITAL_COMMUNITY): Payer: Self-pay | Admitting: Cardiology

## 2020-04-13 DIAGNOSIS — E859 Amyloidosis, unspecified: Secondary | ICD-10-CM | POA: Diagnosis not present

## 2020-04-13 DIAGNOSIS — I5022 Chronic systolic (congestive) heart failure: Secondary | ICD-10-CM

## 2020-04-13 LAB — BASIC METABOLIC PANEL
Anion gap: 9 (ref 5–15)
BUN: 49 mg/dL — ABNORMAL HIGH (ref 8–23)
CO2: 21 mmol/L — ABNORMAL LOW (ref 22–32)
Calcium: 8.9 mg/dL (ref 8.9–10.3)
Chloride: 108 mmol/L (ref 98–111)
Creatinine, Ser: 1.83 mg/dL — ABNORMAL HIGH (ref 0.61–1.24)
GFR calc Af Amer: 37 mL/min — ABNORMAL LOW (ref >60)
GFR calc non Af Amer: 32 mL/min — ABNORMAL LOW (ref >60)
Glucose, Bld: 89 mg/dL (ref 70–99)
Potassium: 5.1 mmol/L (ref 3.5–5.1)
Sodium: 138 mmol/L (ref 135–145)

## 2020-04-23 ENCOUNTER — Other Ambulatory Visit: Payer: Self-pay

## 2020-04-23 ENCOUNTER — Ambulatory Visit (HOSPITAL_COMMUNITY)
Admission: RE | Admit: 2020-04-23 | Discharge: 2020-04-23 | Disposition: A | Discharge status: Home / Self Care | Payer: Medicare Other | Source: Ambulatory Visit | Attending: Cardiology | Admitting: Cardiology

## 2020-04-23 DIAGNOSIS — I5022 Chronic systolic (congestive) heart failure: Secondary | ICD-10-CM | POA: Diagnosis not present

## 2020-04-23 LAB — BASIC METABOLIC PANEL
Anion gap: 8 (ref 5–15)
BUN: 48 mg/dL — ABNORMAL HIGH (ref 8–23)
CO2: 25 mmol/L (ref 22–32)
Calcium: 9.4 mg/dL (ref 8.9–10.3)
Chloride: 105 mmol/L (ref 98–111)
Creatinine, Ser: 1.74 mg/dL — ABNORMAL HIGH (ref 0.61–1.24)
GFR calc Af Amer: 40 mL/min — ABNORMAL LOW (ref >60)
GFR calc non Af Amer: 34 mL/min — ABNORMAL LOW (ref >60)
Glucose, Bld: 91 mg/dL (ref 70–99)
Potassium: 5 mmol/L (ref 3.5–5.1)
Sodium: 138 mmol/L (ref 135–145)

## 2020-06-21 DIAGNOSIS — L570 Actinic keratosis: Secondary | ICD-10-CM | POA: Diagnosis not present

## 2020-06-21 DIAGNOSIS — Z85828 Personal history of other malignant neoplasm of skin: Secondary | ICD-10-CM | POA: Diagnosis not present

## 2020-06-25 ENCOUNTER — Other Ambulatory Visit (HOSPITAL_COMMUNITY): Payer: Self-pay | Admitting: Cardiology

## 2020-06-29 DIAGNOSIS — Z23 Encounter for immunization: Secondary | ICD-10-CM | POA: Diagnosis not present

## 2020-07-03 ENCOUNTER — Ambulatory Visit: Payer: Medicare Other

## 2020-08-08 DIAGNOSIS — M9905 Segmental and somatic dysfunction of pelvic region: Secondary | ICD-10-CM | POA: Diagnosis not present

## 2020-08-08 DIAGNOSIS — M5136 Other intervertebral disc degeneration, lumbar region: Secondary | ICD-10-CM | POA: Diagnosis not present

## 2020-08-08 DIAGNOSIS — S29012A Strain of muscle and tendon of back wall of thorax, initial encounter: Secondary | ICD-10-CM | POA: Diagnosis not present

## 2020-08-08 DIAGNOSIS — M9903 Segmental and somatic dysfunction of lumbar region: Secondary | ICD-10-CM | POA: Diagnosis not present

## 2020-08-08 DIAGNOSIS — M47817 Spondylosis without myelopathy or radiculopathy, lumbosacral region: Secondary | ICD-10-CM | POA: Diagnosis not present

## 2020-08-08 DIAGNOSIS — M9902 Segmental and somatic dysfunction of thoracic region: Secondary | ICD-10-CM | POA: Diagnosis not present

## 2020-11-05 DIAGNOSIS — M542 Cervicalgia: Secondary | ICD-10-CM | POA: Diagnosis not present

## 2020-11-05 DIAGNOSIS — S46012A Strain of muscle(s) and tendon(s) of the rotator cuff of left shoulder, initial encounter: Secondary | ICD-10-CM | POA: Diagnosis not present

## 2020-11-05 DIAGNOSIS — S46011A Strain of muscle(s) and tendon(s) of the rotator cuff of right shoulder, initial encounter: Secondary | ICD-10-CM | POA: Diagnosis not present

## 2020-11-21 ENCOUNTER — Encounter (HOSPITAL_COMMUNITY): Payer: Self-pay | Admitting: Cardiology

## 2020-11-21 ENCOUNTER — Ambulatory Visit (HOSPITAL_COMMUNITY)
Admission: RE | Admit: 2020-11-21 | Discharge: 2020-11-21 | Disposition: A | Discharge status: Home / Self Care | Payer: Medicare Other | Source: Ambulatory Visit | Attending: Cardiology | Admitting: Cardiology

## 2020-11-21 VITALS — BP 140/70 | HR 71 | Wt 149.0 lb

## 2020-11-21 DIAGNOSIS — F329 Major depressive disorder, single episode, unspecified: Secondary | ICD-10-CM | POA: Diagnosis not present

## 2020-11-21 DIAGNOSIS — I5022 Chronic systolic (congestive) heart failure: Secondary | ICD-10-CM | POA: Diagnosis not present

## 2020-11-21 DIAGNOSIS — E8589 Other amyloidosis: Secondary | ICD-10-CM | POA: Diagnosis not present

## 2020-11-21 DIAGNOSIS — I110 Hypertensive heart disease with heart failure: Secondary | ICD-10-CM | POA: Insufficient documentation

## 2020-11-21 DIAGNOSIS — Z79899 Other long term (current) drug therapy: Secondary | ICD-10-CM | POA: Diagnosis not present

## 2020-11-21 DIAGNOSIS — E785 Hyperlipidemia, unspecified: Secondary | ICD-10-CM | POA: Insufficient documentation

## 2020-11-21 DIAGNOSIS — I447 Left bundle-branch block, unspecified: Secondary | ICD-10-CM | POA: Diagnosis not present

## 2020-11-21 DIAGNOSIS — E859 Amyloidosis, unspecified: Secondary | ICD-10-CM

## 2020-11-21 DIAGNOSIS — I428 Other cardiomyopathies: Secondary | ICD-10-CM | POA: Insufficient documentation

## 2020-11-21 DIAGNOSIS — I491 Atrial premature depolarization: Secondary | ICD-10-CM | POA: Insufficient documentation

## 2020-11-21 HISTORY — DX: Heart failure, unspecified: I50.9

## 2020-11-21 LAB — BASIC METABOLIC PANEL
Anion gap: 11 (ref 5–15)
BUN: 40 mg/dL — ABNORMAL HIGH (ref 8–23)
CO2: 24 mmol/L (ref 22–32)
Calcium: 9.4 mg/dL (ref 8.9–10.3)
Chloride: 105 mmol/L (ref 98–111)
Creatinine, Ser: 1.56 mg/dL — ABNORMAL HIGH (ref 0.61–1.24)
GFR, Estimated: 42 mL/min — ABNORMAL LOW (ref >60)
Glucose, Bld: 102 mg/dL — ABNORMAL HIGH (ref 70–99)
Potassium: 5.1 mmol/L (ref 3.5–5.1)
Sodium: 140 mmol/L (ref 135–145)

## 2020-11-21 LAB — CBC
HCT: 43.2 % (ref 39.0–52.0)
Hemoglobin: 14.2 g/dL (ref 13.0–17.0)
MCH: 33.8 pg (ref 26.0–34.0)
MCHC: 32.9 g/dL (ref 30.0–36.0)
MCV: 102.9 fL — ABNORMAL HIGH (ref 80.0–100.0)
Platelets: 241 10*3/uL (ref 150–400)
RBC: 4.2 MIL/uL — ABNORMAL LOW (ref 4.22–5.81)
RDW: 14.5 % (ref 11.5–15.5)
WBC: 8.6 10*3/uL (ref 4.0–10.5)
nRBC: 0 % (ref 0.0–0.2)

## 2020-11-21 LAB — TSH: TSH: 3.123 u[IU]/mL (ref 0.350–4.500)

## 2020-11-21 NOTE — Patient Instructions (Signed)
EKG done today.  Labs done today. We will contact you only if your labs are abnormal.  No medication changes were made. Please continue all current medications as prescribed.  Your physician recommends that you schedule a follow-up appointment in: 3 months with an echo prior to your exam.  Your physician has requested that you have an echocardiogram. Echocardiography is a painless test that uses sound waves to create images of your heart. It provides your doctor with information about the size and shape of your heart and how well your heart's chambers and valves are working. This procedure takes approximately one hour. There are no restrictions for this procedure.   If you have any questions or concerns before your next appointment please send [place] a message through [place] or call our office at [phone].    TO LEAVE A MESSAGE FOR THE NURSE SELECT OPTION 2, PLEASE LEAVE A MESSAGE INCLUDING: . YOUR NAME . DATE OF BIRTH . CALL BACK NUMBER . REASON FOR CALL**this is important as we prioritize the call backs  YOU WILL RECEIVE A CALL BACK THE SAME DAY AS LONG AS YOU CALL BEFORE 4:00 PM   Do the following things EVERYDAY: 1) Weigh yourself in the morning before breakfast. Write it down and keep it in a log. 2) Take your medicines as prescribed 3) Eat low salt foods--Limit salt (sodium) to 2000 mg per day.  4) Stay as active as you can everyday 5) Limit all fluids for the day to less than 2 liters   At the [facility], you and your health needs are our priority. As part of our continuing mission to provide you with exceptional heart care, we have created designated Provider Care Teams. These Care Teams include your primary Cardiologist (physician) and Advanced Practice Providers (APPs- Physician Assistants and Nurse Practitioners) who all work together to provide you with the care you need, when you need it.   You may see any of the following providers on your designated  Care Team at your next follow up: Marland Kitchen Dr Glori Bickers . Dr Loralie Champagne . Darrick Grinder, NP . Lyda Jester, PA . Audry Riles, PharmD   Please be sure to bring in all your medications bottles to every appointment.

## 2020-11-24 NOTE — Progress Notes (Signed)
PCP: Dr. Inda Merlin Cardiology: Dr. Aundra Dubin  87 y.o. returns for followup of CHF.  I took care of his wife in the past.  He had been generally health up until this fall.  He developed worsening fatigue as well as exertional dyspnea.  His PCP started him on Lasix 20 mg daily, which helped his breathing.  Echo was done (cannot review as done at non-[facility] facility) showing severe LVH with a speckled pattern concerning for amyloidosis.  Also noted was EF of 20%, diffuse hypokinesis, and moderate RV dysfunction.  LHC/RHC was done in 12/20, showing no significant CAD and optimized filling pressures with low cardiac output.    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    He saw Dr. Irene Limbo for evaluation of monoclonal gammopathy.  He would not want bone marrow biopsy or endomyocardial biopsy, and he would not want chemotherapy if multiple myeloma were found.  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   He is symptomatically stable. Weight is down 6 lbs.  He exercises with weight for 20-30 minutes/day.  He can climb a flight of stairs without problems.  He is short of breath if he walks a long distance.  He has generalized fatigue.  No chest pain.  Occasional lightheadedness if he stands too fast.  Mood is depressed.   Labs (12/20): K 5.2 => 4.9, creatinine 1.26 => 1.32, BNP 628, hgb 14.4.  Serum immunofixation with IgA monoclonal protein (light chain).  Urine immunofixation negative.  Labs (1/21): K 5.2 => 4.5, creatinine 1.31 => 1.26 Labs (3/21): K 4.6, creatinine 1.44 Labs (5/21): K 4.8, creatinine 1.38 Labs (7/21): K 5, creatinine 1.74  ECG (personally reviewed): NSR, 1st degree AVB, LBBB 128 msec  PMH: 1. HTN 2. Hyperlipidemia 3. Prostate cancer 4. Osteoarthritis 5. Macular degeneration.  6. Chronic systolic CHF:  - Echo (24/23): EF 55-60%, normal RV.  - Echo (11/20): Severe LVH with speckled pattern  concerning for amyloidosis, EF 20% with diffuse hypokinesis, moderately decreased RV systolic function.  - LHC/RHC (12/20): No significant CAD.  Mean RA 2, PA 36/15, mean PCWP 14, CI 2.06.  - PYP scan: grade 3, H/CL ratio 2.8. Strongly suggestive of transthyretin amyloidosis.  Invitae gene testing negative for typical ATTR mutations, suggesting wild-type ATTR.  7. Monoclonal gammopathy  SH: Widower, retired, lives in [place].  Nonsmoker, occasional ETOH (not heavy).   FH: No history of premature CAD or cardiomyopathy.   ROS: All systems reviewed and negative except as per HPI.   Current Outpatient Medications  Medication Sig Dispense Refill  . carvedilol (COREG) 6.25 MG tablet TAKE 1 TABLET BY MOUTH TWICE DAILY. 180 tablet 3  . dapagliflozin propanediol (FARXIGA) 10 MG TABS tablet Take 1 tablet (10 mg total) by mouth daily before breakfast. 30 tablet 11  . Glucosamine 500 MG CAPS Take 500 mg by mouth daily.     . Melatonin 5 MG TABS Take 5 mg by mouth daily.     . Multiple Vitamin (MULTIVITAMIN WITH MINERALS) TABS tablet Take 1 tablet by mouth daily.    . Multiple Vitamins-Minerals (PRESERVISION AREDS PO) Take 1 tablet by mouth in the morning and at bedtime.    . sacubitril-valsartan (ENTRESTO) 97-103 MG Take 1 tablet by mouth 2 (two) times daily. 60 tablet 11  . simvastatin (ZOCOR) 20 MG tablet Take 20 mg by mouth every morning.    Marland Kitchen spironolactone (ALDACTONE) 25 MG tablet TAKE 1 TABLET ONCE  DAILY. 30 tablet 11  . Tafamidis 61 MG CAPS Take 61 mg by mouth daily. 30 capsule 11   No current facility-administered medications for this encounter.   BP 140/70   Pulse 71   Wt 67.6 kg (149 lb)   SpO2 98%   BMI 22.00 kg/m  General: NAD Neck: No JVD, no thyromegaly or thyroid nodule.  Lungs: Clear to auscultation bilaterally with normal respiratory effort. CV: Nondisplaced PMI.  Heart regular S1/S2, no S3/S4, no murmur.  No peripheral edema.  No carotid bruit.  Normal pedal pulses.   Abdomen: Soft, nontender, no hepatosplenomegaly, no distention.  Skin: Intact without lesions or rashes.  Neurologic: Alert and oriented x 3.  Psych: depressed affect. Extremities: No clubbing or cyanosis.  HEENT: Normal.   Assessment/Plan: 1. Chronic systolic CHF: Nonischemic cardiomyopathy. Echo (11/20) with severe LVH and speckled pattern concerning for cardiac amyloidosis, EF 20%, moderately decreased RV systolic function.  LHC/RHC in 12/20 showed no significant CAD, filling pressures not elevated but cardiac output moderately depressed.  Appearance of myocardium is suggestive of amyloidosis, but this usually is not associated with a profound fall in EF. He does have symptoms that may be consistent with mild peripheral neuropathy.  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  NYHA class II symptoms, not volume overloaded on exam.  - Continue Entresto 97/103 bid. - Continue Coreg 6.25 mg bid, will not increase as HR runs slow at times.   - Continue spironolactone, BMET today.   - He is not on Lasix.  - Continue Farxiga 10 mg daily.   - Repeat echo at followup appt. 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  Has some symptoms of peripheral neuropathy.  Myocardial appearance on echo is suggestive of amyloidosis.  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  However, serum immunofixation did show a monoclonal IgA light chain.  I suspect that this is ATTR amyloidosis given the strong positivity of the PYP scan.  He was seen by hematology (Dr. Irene Limbo) given the monoclonal light chain.  He does not want aggressive workup of the monoclonal gammopathy (no biopsies, would not want chemotherapy if multiple myeloma were found).  Invitae gene testing was negative for the common ATTR mutations, suggesting wild type ATTR amyloidosis.  Dr. 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  - Continue tafamidis.   3. Depression: Strongly suspect.  I offered him pharmacologic  treatment but he is not interested at this time.   Followup in 3 months with echo.   Loralie Champagne 11/24/2020

## 2020-12-20 DIAGNOSIS — L820 Inflamed seborrheic keratosis: Secondary | ICD-10-CM | POA: Diagnosis not present

## 2020-12-20 DIAGNOSIS — L812 Freckles: Secondary | ICD-10-CM | POA: Diagnosis not present

## 2020-12-20 DIAGNOSIS — D1801 Hemangioma of skin and subcutaneous tissue: Secondary | ICD-10-CM | POA: Diagnosis not present

## 2020-12-20 DIAGNOSIS — L821 Other seborrheic keratosis: Secondary | ICD-10-CM | POA: Diagnosis not present

## 2020-12-20 DIAGNOSIS — Z85828 Personal history of other malignant neoplasm of skin: Secondary | ICD-10-CM | POA: Diagnosis not present

## 2020-12-20 DIAGNOSIS — L570 Actinic keratosis: Secondary | ICD-10-CM | POA: Diagnosis not present

## 2021-01-02 ENCOUNTER — Other Ambulatory Visit (HOSPITAL_COMMUNITY): Payer: Self-pay | Admitting: Cardiology

## 2021-02-06 DIAGNOSIS — S138XXA Sprain of joints and ligaments of other parts of neck, initial encounter: Secondary | ICD-10-CM | POA: Diagnosis not present

## 2021-02-06 DIAGNOSIS — S29012A Strain of muscle and tendon of back wall of thorax, initial encounter: Secondary | ICD-10-CM | POA: Diagnosis not present

## 2021-02-06 DIAGNOSIS — M9902 Segmental and somatic dysfunction of thoracic region: Secondary | ICD-10-CM | POA: Diagnosis not present

## 2021-02-06 DIAGNOSIS — M9901 Segmental and somatic dysfunction of cervical region: Secondary | ICD-10-CM | POA: Diagnosis not present

## 2021-02-13 ENCOUNTER — Other Ambulatory Visit (HOSPITAL_COMMUNITY): Payer: Self-pay | Admitting: Cardiology

## 2021-02-22 ENCOUNTER — Ambulatory Visit (HOSPITAL_BASED_OUTPATIENT_CLINIC_OR_DEPARTMENT_OTHER)
Admission: RE | Admit: 2021-02-22 | Discharge: 2021-02-22 | Disposition: A | Discharge status: Home / Self Care | Payer: Medicare Other | Source: Ambulatory Visit | Attending: Cardiology | Admitting: Cardiology

## 2021-02-22 ENCOUNTER — Ambulatory Visit (HOSPITAL_COMMUNITY)
Admission: RE | Admit: 2021-02-22 | Discharge: 2021-02-22 | Disposition: A | Discharge status: Home / Self Care | Payer: Medicare Other | Source: Ambulatory Visit | Attending: Internal Medicine | Admitting: Internal Medicine

## 2021-02-22 ENCOUNTER — Encounter (HOSPITAL_COMMUNITY): Payer: Self-pay | Admitting: Cardiology

## 2021-02-22 ENCOUNTER — Other Ambulatory Visit: Payer: Self-pay

## 2021-02-22 VITALS — BP 120/72 | HR 55 | Wt 154.4 lb

## 2021-02-22 DIAGNOSIS — Z79899 Other long term (current) drug therapy: Secondary | ICD-10-CM | POA: Diagnosis not present

## 2021-02-22 DIAGNOSIS — Z7984 Long term (current) use of oral hypoglycemic drugs: Secondary | ICD-10-CM | POA: Insufficient documentation

## 2021-02-22 DIAGNOSIS — I080 Rheumatic disorders of both mitral and aortic valves: Secondary | ICD-10-CM | POA: Insufficient documentation

## 2021-02-22 DIAGNOSIS — F32A Depression, unspecified: Secondary | ICD-10-CM | POA: Diagnosis not present

## 2021-02-22 DIAGNOSIS — I110 Hypertensive heart disease with heart failure: Secondary | ICD-10-CM | POA: Insufficient documentation

## 2021-02-22 DIAGNOSIS — E8589 Other amyloidosis: Secondary | ICD-10-CM | POA: Diagnosis not present

## 2021-02-22 DIAGNOSIS — I428 Other cardiomyopathies: Secondary | ICD-10-CM | POA: Insufficient documentation

## 2021-02-22 DIAGNOSIS — E859 Amyloidosis, unspecified: Secondary | ICD-10-CM

## 2021-02-22 DIAGNOSIS — I5022 Chronic systolic (congestive) heart failure: Secondary | ICD-10-CM

## 2021-02-22 DIAGNOSIS — E785 Hyperlipidemia, unspecified: Secondary | ICD-10-CM | POA: Insufficient documentation

## 2021-02-22 DIAGNOSIS — I447 Left bundle-branch block, unspecified: Secondary | ICD-10-CM | POA: Diagnosis not present

## 2021-02-22 LAB — BASIC METABOLIC PANEL
Anion gap: 10 (ref 5–15)
BUN: 34 mg/dL — ABNORMAL HIGH (ref 8–23)
CO2: 22 mmol/L (ref 22–32)
Calcium: 9.4 mg/dL (ref 8.9–10.3)
Chloride: 107 mmol/L (ref 98–111)
Creatinine, Ser: 1.24 mg/dL (ref 0.61–1.24)
GFR, Estimated: 56 mL/min — ABNORMAL LOW (ref >60)
Glucose, Bld: 91 mg/dL (ref 70–99)
Potassium: 4.3 mmol/L (ref 3.5–5.1)
Sodium: 139 mmol/L (ref 135–145)

## 2021-02-22 LAB — ECHOCARDIOGRAM COMPLETE
AR max vel: 2.28 cm2
AV Area VTI: 2.18 cm2
AV Area mean vel: 2.31 cm2
AV Mean grad: 3 mmHg
AV Peak grad: 5.7 mmHg
Ao pk vel: 1.19 m/s
Area-P 1/2: 2.29 cm2
Calc EF: 37.4 %
MV VTI: 2.34 cm2
P 1/2 time: 741 msec
S' Lateral: 3.3 cm
Single Plane A2C EF: 39.2 %
Single Plane A4C EF: 35 %

## 2021-02-22 MED ORDER — CARVEDILOL 3.125 MG PO TABS: 3.1250 mg | ORAL_TABLET | Freq: Two times a day (BID) | ORAL | 3 refills | Status: DC

## 2021-02-22 NOTE — Progress Notes (Signed)
  Echocardiogram 2D Echocardiogram has been performed.  Corey Harrison 02/22/2021, 11:02 AM

## 2021-02-22 NOTE — Patient Instructions (Signed)
EKG done today.  Labs done today. We will contact you only if your labs are abnormal.  DECREASE Carvedilol to 3.125mg  (1 tablet) by mouth 2 times daily.   No other medication changes were made. Please continue all current medications as prescribed.  Your physician recommends that you schedule a follow-up appointment in: 3 months  If you have any questions or concerns before your next appointment please send [place] a message through [place] or call our office at [phone].    TO LEAVE A MESSAGE FOR THE NURSE SELECT OPTION 2, PLEASE LEAVE A MESSAGE INCLUDING: . YOUR NAME . DATE OF BIRTH . CALL BACK NUMBER . REASON FOR CALL**this is important as we prioritize the call backs  YOU WILL RECEIVE A CALL BACK THE SAME DAY AS LONG AS YOU CALL BEFORE 4:00 PM   Do the following things EVERYDAY: 1) Weigh yourself in the morning before breakfast. Write it down and keep it in a log. 2) Take your medicines as prescribed 3) Eat low salt foods--Limit salt (sodium) to 2000 mg per day.  4) Stay as active as you can everyday 5) Limit all fluids for the day to less than 2 liters   At the [facility], you and your health needs are our priority. As part of our continuing mission to provide you with exceptional heart care, we have created designated Provider Care Teams. These Care Teams include your primary Cardiologist (physician) and Advanced Practice Providers (APPs- Physician Assistants and Nurse Practitioners) who all work together to provide you with the care you need, when you need it.   You may see any of the following providers on your designated Care Team at your next follow up: Marland Kitchen Dr Glori Bickers . Dr Loralie Champagne . Darrick Grinder, NP . Lyda Jester, PA . Audry Riles, PharmD   Please be sure to bring in all your medications bottles to every appointment.

## 2021-02-24 NOTE — Progress Notes (Signed)
PCP: Dr. Inda Merlin Cardiology: Dr. Aundra Dubin  87 y.o. returns for followup of CHF.  I took care of his wife in the past.  He had been generally health up until this fall.  He developed worsening fatigue as well as exertional dyspnea.  His PCP started him on Lasix 20 mg daily, which helped his breathing.  Echo was done (cannot review as done at non-[facility] facility) showing severe LVH with a speckled pattern concerning for amyloidosis.  Also noted was EF of 20%, diffuse hypokinesis, and moderate RV dysfunction.  LHC/RHC was done in 12/20, showing no significant CAD and optimized filling pressures with low cardiac output.    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    He saw Dr. Irene Limbo for evaluation of monoclonal gammopathy.  He would not want bone marrow biopsy or endomyocardial biopsy, and he would not want chemotherapy if multiple myeloma were found.  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   Echo was done today and reviewed, EF 35-40%, severe LVH, mildly decreased RV systolic function, mild RVH, mild AI, mild MR.   He is symptomatically stable. Weight is up a bit.  He continues to exercise with weights and doing stretching.  He is not walking much.  Says he will get short of breath walking < 5 minutes.  He is, however, still able to walk around stores.  HR is slow, 40s-50s.  Mood remains depressed, no suicidal ideation.   ECG (personally reviewed): NSR, LBBB 42 bpm  Labs (12/20): K 5.2 => 4.9, creatinine 1.26 => 1.32, BNP 628, hgb 14.4.  Serum immunofixation with IgA monoclonal protein (light chain).  Urine immunofixation negative.  Labs (1/21): K 5.2 => 4.5, creatinine 1.31 => 1.26 Labs (3/21): K 4.6, creatinine 1.44 Labs (5/21): K 4.8, creatinine 1.38 Labs (7/21): K 5, creatinine 1.74 Labs (2/22): K 5.1, creatinine 1.56  PMH: 1. HTN 2. Hyperlipidemia 3. Prostate cancer 4. Osteoarthritis 5. Macular  degeneration.  6. Chronic systolic CHF:  - Echo (84/69): EF 55-60%, normal RV.  - Echo (11/20): Severe LVH with speckled pattern concerning for amyloidosis, EF 20% with diffuse hypokinesis, moderately decreased RV systolic function.  - LHC/RHC (12/20): No significant CAD.  Mean RA 2, PA 36/15, mean PCWP 14, CI 2.06.  - PYP scan: grade 3, H/CL ratio 2.8. Strongly suggestive of transthyretin amyloidosis.  Invitae gene testing negative for typical ATTR mutations, suggesting wild-type ATTR.  - Echo (5/22): EF 35-40%, severe LVH, mildly decreased RV systolic function, mild RVH, mild AI, mild MR.  7. Monoclonal gammopathy 8. LBBB  SH: Widower, retired, lives in [place].  Nonsmoker, occasional ETOH (not heavy).   FH: No history of premature CAD or cardiomyopathy.   ROS: All systems reviewed and negative except as per HPI.   Current Outpatient Medications  Medication Sig Dispense Refill  . dapagliflozin propanediol (FARXIGA) 10 MG TABS tablet Take 1 tablet (10 mg total) by mouth daily before breakfast. 30 tablet 11  . ENTRESTO 97-103 MG TAKE 1 TABLET BY MOUTH TWICE DAILY. 60 tablet 11  . Glucosamine 500 MG CAPS Take 500 mg by mouth daily.     . Melatonin 5 MG TABS Take 5 mg by mouth daily.     . Multiple Vitamin (MULTIVITAMIN WITH MINERALS) TABS tablet Take 1 tablet by mouth daily.    . Multiple Vitamins-Minerals (PRESERVISION AREDS PO) Take 1 tablet by mouth in the morning and at bedtime.    Marland Kitchen  simvastatin (ZOCOR) 20 MG tablet Take 20 mg by mouth every morning.    Marland Kitchen spironolactone (ALDACTONE) 25 MG tablet TAKE 1 TABLET ONCE DAILY. 30 tablet 11  . VYNDAMAX 61 MG CAPS TAKE 1 CAPSULE BY MOUTH DAILY 30 capsule 10  . carvedilol (COREG) 3.125 MG tablet Take 1 tablet (3.125 mg total) by mouth 2 (two) times daily. 180 tablet 3   No current facility-administered medications for this encounter.   BP 120/72   Pulse (!) 55   Wt 70 kg (154 lb 6.4 oz)   SpO2 96%   BMI 22.80 kg/m  General:  NAD Neck: No JVD, no thyromegaly or thyroid nodule.  Lungs: Clear to auscultation bilaterally with normal respiratory effort. CV: Nondisplaced PMI.  Heart regular S1/S2, no S3/S4, no murmur.  No peripheral edema.  No carotid bruit.  Normal pedal pulses.  Abdomen: Soft, nontender, no hepatosplenomegaly, no distention.  Skin: Intact without lesions or rashes.  Neurologic: Alert and oriented x 3.  Psych: Normal affect. Extremities: No clubbing or cyanosis.  HEENT: Normal.   Assessment/Plan: 1. Chronic systolic CHF: Nonischemic cardiomyopathy. Echo (11/20) with severe LVH and speckled pattern concerning for cardiac amyloidosis, EF 20%, moderately decreased RV systolic function.  LHC/RHC in 12/20 showed no significant CAD, filling pressures not elevated but cardiac output moderately depressed.  Appearance of myocardium is suggestive of amyloidosis, but this usually is not associated with a profound fall in EF. He does have symptoms that may be consistent with mild peripheral neuropathy.  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  Echo in 5/22 showed EF 35-40%, severe LVH, mildly decreased RV systolic function, mild RVH, mild AI, mild MR.  Chronic NYHA class II symptoms with no change, not volume overloaded on exam.  - Continue Entresto 97/103 bid. - Decrease Coreg to 3.125 mg bid with bradycardia.  - Continue spironolactone, BMET today.   - He is not on Lasix.  - Continue Farxiga 10 mg daily.   - I asked him to increase aerobic exercise, walk daily.  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  Has some symptoms of peripheral neuropathy.  Myocardial appearance on echo is suggestive of amyloidosis.  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  However, serum immunofixation did show a monoclonal IgA light chain.  I suspect that this is ATTR amyloidosis given the strong positivity of the PYP scan.  He was seen by hematology (Dr. Irene Limbo) given the monoclonal light chain.  He does  not want aggressive workup of the monoclonal gammopathy (no biopsies, would not want chemotherapy if multiple myeloma were found).  Invitae gene testing was negative for the common ATTR mutations, suggesting wild type ATTR amyloidosis.  Dr. 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  - Continue tafamidis.   3. Depression: Still with depressed mood.  I offered him pharmacologic treatment but he is not interested at this time.  No suicidal ideation.   Followup in 3 months.   Loralie Champagne 02/24/2021

## 2021-03-20 DIAGNOSIS — Z961 Presence of intraocular lens: Secondary | ICD-10-CM | POA: Diagnosis not present

## 2021-03-20 DIAGNOSIS — H35371 Puckering of macula, right eye: Secondary | ICD-10-CM | POA: Diagnosis not present

## 2021-03-20 DIAGNOSIS — H52203 Unspecified astigmatism, bilateral: Secondary | ICD-10-CM | POA: Diagnosis not present

## 2021-03-27 ENCOUNTER — Other Ambulatory Visit (HOSPITAL_COMMUNITY): Payer: Self-pay | Admitting: Cardiology

## 2021-03-27 DIAGNOSIS — Z87891 Personal history of nicotine dependence: Secondary | ICD-10-CM | POA: Diagnosis not present

## 2021-03-27 DIAGNOSIS — R0602 Shortness of breath: Secondary | ICD-10-CM | POA: Diagnosis not present

## 2021-03-27 DIAGNOSIS — E859 Amyloidosis, unspecified: Secondary | ICD-10-CM | POA: Diagnosis not present

## 2021-03-27 DIAGNOSIS — Z0000 Encounter for general adult medical examination without abnormal findings: Secondary | ICD-10-CM | POA: Diagnosis not present

## 2021-03-27 DIAGNOSIS — I509 Heart failure, unspecified: Secondary | ICD-10-CM | POA: Diagnosis not present

## 2021-03-27 DIAGNOSIS — F4321 Adjustment disorder with depressed mood: Secondary | ICD-10-CM | POA: Diagnosis not present

## 2021-03-27 DIAGNOSIS — D81818 Other biotin-dependent carboxylase deficiency: Secondary | ICD-10-CM | POA: Diagnosis not present

## 2021-03-27 DIAGNOSIS — R0601 Orthopnea: Secondary | ICD-10-CM | POA: Diagnosis not present

## 2021-03-27 DIAGNOSIS — I10 Essential (primary) hypertension: Secondary | ICD-10-CM | POA: Diagnosis not present

## 2021-04-08 ENCOUNTER — Other Ambulatory Visit (HOSPITAL_COMMUNITY): Payer: Self-pay

## 2021-04-09 ENCOUNTER — Other Ambulatory Visit (HOSPITAL_COMMUNITY): Payer: Self-pay

## 2021-04-09 ENCOUNTER — Telehealth (HOSPITAL_COMMUNITY): Payer: Self-pay | Admitting: Pharmacy Technician

## 2021-04-09 NOTE — Telephone Encounter (Signed)
Patient Advocate Encounter   Received notification from [facility] that prior authorization for Vyndamax is required.   PA submitted on CoverMyMeds Key [id number] Status is pending   Will continue to follow.

## 2021-04-15 ENCOUNTER — Other Ambulatory Visit (HOSPITAL_COMMUNITY): Payer: Self-pay

## 2021-04-15 NOTE — Telephone Encounter (Signed)
Called [facility] at [phone], to get a status update on [place] PA. The PA has been sent over for clinical review.   Should have a determination by the end of business, today.

## 2021-04-19 NOTE — Telephone Encounter (Signed)
Advanced Heart Failure Patient Advocate Encounter  PA has been denied. Will submit appeal with additional documentation. Called and updated patient.

## 2021-04-25 ENCOUNTER — Other Ambulatory Visit (HOSPITAL_COMMUNITY): Payer: Self-pay

## 2021-04-25 NOTE — Telephone Encounter (Signed)
Advanced Heart Failure Patient Advocate Encounter  Sent additional information to [facility] via fax, [phone].

## 2021-04-29 NOTE — Telephone Encounter (Signed)
Advanced Heart Failure Patient Advocate Encounter  Prior Authorization for Corey Harrison has been approved.    Effective dates: 04/27/21 through 04/27/22  Called and updated the patient. He is aware that he can call his pharmacy and get a refill now while using the co-pay card. Patient was appreciative.   Charlann Boxer, CPhT

## 2021-05-28 ENCOUNTER — Ambulatory Visit (HOSPITAL_COMMUNITY)
Admission: RE | Admit: 2021-05-28 | Discharge: 2021-05-28 | Disposition: A | Discharge status: Home / Self Care | Payer: Medicare Other | Source: Ambulatory Visit | Attending: Cardiology | Admitting: Cardiology

## 2021-05-28 ENCOUNTER — Encounter (HOSPITAL_COMMUNITY): Payer: Self-pay | Admitting: Cardiology

## 2021-05-28 ENCOUNTER — Other Ambulatory Visit: Payer: Self-pay

## 2021-05-28 VITALS — BP 118/78 | HR 54 | Wt 154.4 lb

## 2021-05-28 DIAGNOSIS — I447 Left bundle-branch block, unspecified: Secondary | ICD-10-CM | POA: Insufficient documentation

## 2021-05-28 DIAGNOSIS — I110 Hypertensive heart disease with heart failure: Secondary | ICD-10-CM | POA: Diagnosis not present

## 2021-05-28 DIAGNOSIS — E785 Hyperlipidemia, unspecified: Secondary | ICD-10-CM | POA: Diagnosis not present

## 2021-05-28 DIAGNOSIS — Z7984 Long term (current) use of oral hypoglycemic drugs: Secondary | ICD-10-CM | POA: Insufficient documentation

## 2021-05-28 DIAGNOSIS — F32A Depression, unspecified: Secondary | ICD-10-CM | POA: Insufficient documentation

## 2021-05-28 DIAGNOSIS — I5022 Chronic systolic (congestive) heart failure: Secondary | ICD-10-CM

## 2021-05-28 DIAGNOSIS — Z79899 Other long term (current) drug therapy: Secondary | ICD-10-CM | POA: Insufficient documentation

## 2021-05-28 DIAGNOSIS — I428 Other cardiomyopathies: Secondary | ICD-10-CM | POA: Insufficient documentation

## 2021-05-28 DIAGNOSIS — G629 Polyneuropathy, unspecified: Secondary | ICD-10-CM | POA: Insufficient documentation

## 2021-05-28 DIAGNOSIS — E8589 Other amyloidosis: Secondary | ICD-10-CM | POA: Insufficient documentation

## 2021-05-28 LAB — BASIC METABOLIC PANEL
Anion gap: 10 (ref 5–15)
BUN: 40 mg/dL — ABNORMAL HIGH (ref 8–23)
CO2: 22 mmol/L (ref 22–32)
Calcium: 9.5 mg/dL (ref 8.9–10.3)
Chloride: 109 mmol/L (ref 98–111)
Creatinine, Ser: 1.42 mg/dL — ABNORMAL HIGH (ref 0.61–1.24)
GFR, Estimated: 47 mL/min — ABNORMAL LOW (ref >60)
Glucose, Bld: 95 mg/dL (ref 70–99)
Potassium: 4.7 mmol/L (ref 3.5–5.1)
Sodium: 141 mmol/L (ref 135–145)

## 2021-05-28 LAB — BRAIN NATRIURETIC PEPTIDE: B Natriuretic Peptide: 667.2 pg/mL — ABNORMAL HIGH (ref 0.0–100.0)

## 2021-05-28 NOTE — Patient Instructions (Addendum)
Labs done today. We will contact you only if your labs are abnormal.  STOP taking Carvedilol   No other medication changes were made. Please continue all current medications as prescribed.  Your physician recommends that you schedule a follow-up appointment in: 4 months  If you have any questions or concerns before your next appointment please send [place] a message through [place] or call our office at [phone].    TO LEAVE A MESSAGE FOR THE NURSE SELECT OPTION 2, PLEASE LEAVE A MESSAGE INCLUDING: YOUR NAME DATE OF BIRTH CALL BACK NUMBER REASON FOR CALL**this is important as we prioritize the call backs  YOU WILL RECEIVE A CALL BACK THE SAME DAY AS LONG AS YOU CALL BEFORE 4:00 PM   Do the following things EVERYDAY: Weigh yourself in the morning before breakfast. Write it down and keep it in a log. Take your medicines as prescribed Eat low salt foods--Limit salt (sodium) to 2000 mg per day.  Stay as active as you can everyday Limit all fluids for the day to less than 2 liters   At the [facility], you and your health needs are our priority. As part of our continuing mission to provide you with exceptional heart care, we have created designated Provider Care Teams. These Care Teams include your primary Cardiologist (physician) and Advanced Practice Providers (APPs- Physician Assistants and Nurse Practitioners) who all work together to provide you with the care you need, when you need it.   You may see any of the following providers on your designated Care Team at your next follow up: Dr Glori Bickers Dr Haynes Kerns, NP Lyda Jester, [place] Audry Riles, PharmD   Please be sure to bring in all your medications bottles to every appointment.

## 2021-05-28 NOTE — Progress Notes (Signed)
PCP: Dr. Inda Merlin Cardiology: Dr. Aundra Dubin  87 y.o. returns for followup of CHF.  I took care of his wife in the past.  He had been generally health up until this fall.  He developed worsening fatigue as well as exertional dyspnea.  His PCP started him on Lasix 20 mg daily, which helped his breathing.  Echo was done (cannot review as done at non-[facility] facility) showing severe LVH with a speckled pattern concerning for amyloidosis.  Also noted was EF of 20%, diffuse hypokinesis, and moderate RV dysfunction.  LHC/RHC was done in 12/20, showing no significant CAD and optimized filling pressures with low cardiac output.    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    He saw Dr. Irene Limbo for evaluation of monoclonal gammopathy.  He would not want bone marrow biopsy or endomyocardial biopsy, and he would not want chemotherapy if multiple myeloma were found.  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   Echo in 5/22 showed EF 35-40%, severe LVH, mildly decreased RV systolic function, mild RVH, mild AI, mild MR.   He feels better since I decreased Coreg.  HR is still in the 50s.  He has "good days and bad days."  Overall, feeling somewhat better, however.  He walks 15-30 minutes daily at [facility], generally without dyspnea.  He exercises with dumbbells several times a week as well.  Balance is not good, but no falls.  No chest pain.  Weight is stable.   Labs (12/20): K 5.2 => 4.9, creatinine 1.26 => 1.32, BNP 628, hgb 14.4.  Serum immunofixation with IgA monoclonal protein (light chain).  Urine immunofixation negative.  Labs (1/21): K 5.2 => 4.5, creatinine 1.31 => 1.26 Labs (3/21): K 4.6, creatinine 1.44 Labs (5/21): K 4.8, creatinine 1.38 Labs (7/21): K 5, creatinine 1.74 Labs (2/22): K 5.1, creatinine 1.56 Labs (5/22): K 4.3, creatinine 1.24  PMH: 1. HTN 2. Hyperlipidemia 3. Prostate cancer 4. Osteoarthritis 5.  Macular degeneration.  6. Chronic systolic CHF:  - Echo (48/25): EF 55-60%, normal RV.  - Echo (11/20): Severe LVH with speckled pattern concerning for amyloidosis, EF 20% with diffuse hypokinesis, moderately decreased RV systolic function.  - LHC/RHC (12/20): No significant CAD.  Mean RA 2, PA 36/15, mean PCWP 14, CI 2.06.  - PYP scan: grade 3, H/CL ratio 2.8. Strongly suggestive of transthyretin amyloidosis.  Invitae gene testing negative for typical ATTR mutations, suggesting wild-type ATTR.  - Echo (5/22): EF 35-40%, severe LVH, mildly decreased RV systolic function, mild RVH, mild AI, mild MR.  7. Monoclonal gammopathy 8. LBBB  SH: Widower, retired, lives in [place].  Nonsmoker, occasional ETOH (not heavy).   FH: No history of premature CAD or cardiomyopathy.   ROS: All systems reviewed and negative except as per HPI.   Current Outpatient Medications  Medication Sig Dispense Refill   ENTRESTO 97-103 MG TAKE 1 TABLET BY MOUTH TWICE DAILY. 60 tablet 11   FARXIGA 10 MG TABS tablet TAKE 1 TABLET ONCE DAILY BEFORE BREAKFAST. 30 tablet 11   Glucosamine 500 MG CAPS Take 500 mg by mouth daily.      Melatonin 5 MG TABS Take 5 mg by mouth daily.      Multiple Vitamin (MULTIVITAMIN WITH MINERALS) TABS tablet Take 1 tablet by mouth daily.     Multiple Vitamins-Minerals (PRESERVISION AREDS PO) Take 1 tablet by mouth in the morning and at bedtime.     spironolactone (ALDACTONE)  25 MG tablet TAKE 1 TABLET ONCE DAILY. 30 tablet 11   VYNDAMAX 61 MG CAPS TAKE 1 CAPSULE BY MOUTH DAILY 30 capsule 10   simvastatin (ZOCOR) 20 MG tablet Take 20 mg by mouth every morning.     No current facility-administered medications for this encounter.   BP 118/78   Pulse (!) 54   Wt 70 kg (154 lb 6.4 oz)   SpO2 98%   BMI 22.80 kg/m  General: NAD Neck: No JVD, no thyromegaly or thyroid nodule.  Lungs: Clear to auscultation bilaterally with normal respiratory effort. CV: Nondisplaced PMI.  Heart regular  S1/S2, no S3/S4, 1/6 SEM RUSB.  No peripheral edema.  No carotid bruit.  Normal pedal pulses.  Abdomen: Soft, nontender, no hepatosplenomegaly, no distention.  Skin: Intact without lesions or rashes.  Neurologic: Alert and oriented x 3.  Psych: Normal affect. Extremities: No clubbing or cyanosis.  HEENT: Normal.   Assessment/Plan: 1. Chronic systolic CHF: Nonischemic cardiomyopathy. Echo (11/20) with severe LVH and speckled pattern concerning for cardiac amyloidosis, EF 20%, moderately decreased RV systolic function.  LHC/RHC in 12/20 showed no significant CAD, filling pressures not elevated but cardiac output moderately depressed.  Appearance of myocardium is suggestive of amyloidosis, but this usually is not associated with a profound fall in EF. He does have symptoms that may be consistent with mild peripheral neuropathy.  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  Echo in 5/22 showed EF 35-40%, severe LVH, mildly decreased RV systolic function, mild RVH, mild AI, mild MR.  Chronic NYHA class II symptoms, somewhat improved since decreasing Coreg.  Not volume overloaded on exam.  - Continue Entresto 97/103 bid. - Still mildly bradycardic, feels better on lower dose of Coreg, so I will stop Coreg entirely.   - Continue spironolactone, BMET/BNP today.   - He is not on Lasix.  - Continue Farxiga 10 mg daily.   - Continue to exercise regularly.  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  Has some symptoms of peripheral neuropathy.  Myocardial appearance on echo is suggestive of amyloidosis.  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  However, serum immunofixation did show a monoclonal IgA light chain.  I suspect that this is ATTR amyloidosis given the strong positivity of the PYP scan.  He was seen by hematology (Dr. Irene Limbo) given the monoclonal light chain.  He does not want aggressive workup of the monoclonal gammopathy (no biopsies, would not want chemotherapy if  multiple myeloma were found).  Invitae gene testing was negative for the common ATTR mutations, suggesting wild type ATTR amyloidosis.  Dr. 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  - Continue tafamidis.   3. Depression: Mood seems improved today.  Followup in 4 months.   Loralie Champagne 05/28/2021

## 2021-06-24 DIAGNOSIS — L570 Actinic keratosis: Secondary | ICD-10-CM | POA: Diagnosis not present

## 2021-06-24 DIAGNOSIS — Z85828 Personal history of other malignant neoplasm of skin: Secondary | ICD-10-CM | POA: Diagnosis not present

## 2021-07-11 ENCOUNTER — Other Ambulatory Visit (HOSPITAL_COMMUNITY): Payer: Self-pay | Admitting: Cardiology

## 2021-08-21 DIAGNOSIS — M9902 Segmental and somatic dysfunction of thoracic region: Secondary | ICD-10-CM | POA: Diagnosis not present

## 2021-08-21 DIAGNOSIS — M9901 Segmental and somatic dysfunction of cervical region: Secondary | ICD-10-CM | POA: Diagnosis not present

## 2021-08-21 DIAGNOSIS — S138XXA Sprain of joints and ligaments of other parts of neck, initial encounter: Secondary | ICD-10-CM | POA: Diagnosis not present

## 2021-08-21 DIAGNOSIS — S29012A Strain of muscle and tendon of back wall of thorax, initial encounter: Secondary | ICD-10-CM | POA: Diagnosis not present

## 2021-09-27 ENCOUNTER — Other Ambulatory Visit: Payer: Self-pay

## 2021-09-27 ENCOUNTER — Encounter (HOSPITAL_COMMUNITY): Payer: Self-pay | Admitting: Cardiology

## 2021-09-27 ENCOUNTER — Ambulatory Visit (HOSPITAL_COMMUNITY)
Admission: RE | Admit: 2021-09-27 | Discharge: 2021-09-27 | Disposition: A | Discharge status: Home / Self Care | Payer: Medicare Other | Source: Ambulatory Visit | Attending: Cardiology | Admitting: Cardiology

## 2021-09-27 VITALS — BP 134/78 | HR 68 | Wt 158.0 lb

## 2021-09-27 DIAGNOSIS — I428 Other cardiomyopathies: Secondary | ICD-10-CM | POA: Diagnosis not present

## 2021-09-27 DIAGNOSIS — Z7984 Long term (current) use of oral hypoglycemic drugs: Secondary | ICD-10-CM | POA: Diagnosis not present

## 2021-09-27 DIAGNOSIS — I110 Hypertensive heart disease with heart failure: Secondary | ICD-10-CM | POA: Diagnosis not present

## 2021-09-27 DIAGNOSIS — F32A Depression, unspecified: Secondary | ICD-10-CM | POA: Insufficient documentation

## 2021-09-27 DIAGNOSIS — Z7182 Exercise counseling: Secondary | ICD-10-CM | POA: Insufficient documentation

## 2021-09-27 DIAGNOSIS — Z79899 Other long term (current) drug therapy: Secondary | ICD-10-CM | POA: Diagnosis not present

## 2021-09-27 DIAGNOSIS — I5022 Chronic systolic (congestive) heart failure: Secondary | ICD-10-CM

## 2021-09-27 DIAGNOSIS — E859 Amyloidosis, unspecified: Secondary | ICD-10-CM

## 2021-09-27 LAB — BASIC METABOLIC PANEL
Anion gap: 7 (ref 5–15)
BUN: 36 mg/dL — ABNORMAL HIGH (ref 8–23)
CO2: 24 mmol/L (ref 22–32)
Calcium: 9.4 mg/dL (ref 8.9–10.3)
Chloride: 107 mmol/L (ref 98–111)
Creatinine, Ser: 1.48 mg/dL — ABNORMAL HIGH (ref 0.61–1.24)
GFR, Estimated: 45 mL/min — ABNORMAL LOW (ref >60)
Glucose, Bld: 105 mg/dL — ABNORMAL HIGH (ref 70–99)
Potassium: 5.1 mmol/L (ref 3.5–5.1)
Sodium: 138 mmol/L (ref 135–145)

## 2021-09-27 NOTE — Patient Instructions (Signed)
Medication Changes:  No change  Lab Work:  Labs done today, your results will be available in MyChart, we will contact you for abnormal readings.   Testing/Procedures:  none  Referrals:  none  Special Instructions // Education:  none  Follow-Up in: 4 months (April 2023) ** Call office in march for appointment**  At the [facility], you and your health needs are our priority. We have a designated team specialized in the treatment of Heart Failure. This Care Team includes your primary Heart Failure Specialized Cardiologist (physician), Advanced Practice Providers (APPs- Physician Assistants and Nurse Practitioners), and Pharmacist who all work together to provide you with the care you need, when you need it.   You may see any of the following providers on your designated Care Team at your next follow up:  Dr Glori Bickers Dr Haynes Kerns, NP Lyda Jester, [place] [facility], [place] Audry Riles, PharmD   Please be sure to bring in all your medications bottles to every appointment.   Need to Contact us:  If you have any questions or concerns before your next appointment please send [place] a message through [place] or call our office at [phone].    TO LEAVE A MESSAGE FOR THE NURSE SELECT OPTION 2, PLEASE LEAVE A MESSAGE INCLUDING: YOUR NAME DATE OF BIRTH CALL BACK NUMBER REASON FOR CALL**this is important as we prioritize the call backs  YOU WILL RECEIVE A CALL BACK THE SAME DAY AS LONG AS YOU CALL BEFORE 4:00 PM

## 2021-09-29 NOTE — Progress Notes (Signed)
PCP: Dr. Inda Merlin Cardiology: Dr. Aundra Dubin  88 y.o. returns for followup of CHF.  I took care of his wife in the past.  He had been generally health up until this fall.  He developed worsening fatigue as well as exertional dyspnea.  His PCP started him on Lasix 20 mg daily, which helped his breathing.  Echo was done (cannot review as done at non-[facility] facility) showing severe LVH with a speckled pattern concerning for amyloidosis.  Also noted was EF of 20%, diffuse hypokinesis, and moderate RV dysfunction.  LHC/RHC was done in 12/20, showing no significant CAD and optimized filling pressures with low cardiac output.    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    He saw Dr. Irene Limbo for evaluation of monoclonal gammopathy.  He would not want bone marrow biopsy or endomyocardial biopsy, and he would not want chemotherapy if multiple myeloma were found.  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   Echo in 5/22 showed EF 35-40%, severe LVH, mildly decreased RV systolic function, mild RVH, mild AI, mild MR.   He feels much better completely off Coreg. Less fatigue.  He walks for 15-20 minutes/day.  No dyspnea walking on flat ground.  No problems walking up a flight of stairs.  Mild tingling in his feet bilaterally, no change.   No GI symptoms.   Labs (12/20): K 5.2 => 4.9, creatinine 1.26 => 1.32, BNP 628, hgb 14.4.  Serum immunofixation with IgA monoclonal protein (light chain).  Urine immunofixation negative.  Labs (1/21): K 5.2 => 4.5, creatinine 1.31 => 1.26 Labs (3/21): K 4.6, creatinine 1.44 Labs (5/21): K 4.8, creatinine 1.38 Labs (7/21): K 5, creatinine 1.74 Labs (2/22): K 5.1, creatinine 1.56 Labs (5/22): K 4.3, creatinine 1.24 Labs (8/22): BNP 667, K 4.7, creatinine 1.42  PMH: 1. HTN 2. Hyperlipidemia 3. Prostate cancer 4. Osteoarthritis 5. Macular degeneration.  6. Chronic systolic CHF:  - Echo  (44/31): EF 55-60%, normal RV.  - Echo (11/20): Severe LVH with speckled pattern concerning for amyloidosis, EF 20% with diffuse hypokinesis, moderately decreased RV systolic function.  - LHC/RHC (12/20): No significant CAD.  Mean RA 2, PA 36/15, mean PCWP 14, CI 2.06.  - PYP scan: grade 3, H/CL ratio 2.8. Strongly suggestive of transthyretin amyloidosis.  Invitae gene testing negative for typical ATTR mutations, suggesting wild-type ATTR.  - Echo (5/22): EF 35-40%, severe LVH, mildly decreased RV systolic function, mild RVH, mild AI, mild MR.  7. Monoclonal gammopathy 8. LBBB  SH: Widower, retired, lives in [place].  Nonsmoker, occasional ETOH (not heavy).   FH: No history of premature CAD or cardiomyopathy.   ROS: All systems reviewed and negative except as per HPI.   Current Outpatient Medications  Medication Sig Dispense Refill   ENTRESTO 97-103 MG TAKE 1 TABLET BY MOUTH TWICE DAILY. 60 tablet 11   FARXIGA 10 MG TABS tablet TAKE 1 TABLET ONCE DAILY BEFORE BREAKFAST. 30 tablet 11   Glucosamine 500 MG CAPS Take 500 mg by mouth daily.      Melatonin 5 MG TABS Take 5 mg by mouth daily.      Multiple Vitamin (MULTIVITAMIN WITH MINERALS) TABS tablet Take 1 tablet by mouth at bedtime.     simvastatin (ZOCOR) 20 MG tablet Take 20 mg by mouth at bedtime.     spironolactone (ALDACTONE) 25 MG tablet TAKE 1 TABLET ONCE DAILY. 30 tablet 11   VYNDAMAX 61 MG  CAPS TAKE 1 CAPSULE BY MOUTH DAILY 30 capsule 10   No current facility-administered medications for this encounter.   BP 134/78    Pulse 68    Wt 71.7 kg (158 lb)    SpO2 98%    BMI 23.33 kg/m  General: NAD Neck: No JVD, no thyromegaly or thyroid nodule.  Lungs: Clear to auscultation bilaterally with normal respiratory effort. CV: Nondisplaced PMI.  Heart regular S1/S2, no S3/S4, no murmur.  No peripheral edema.  No carotid bruit.  Normal pedal pulses.  Abdomen: Soft, nontender, no hepatosplenomegaly, no distention.  Skin: Intact  without lesions or rashes.  Neurologic: Alert and oriented x 3.  Psych: Normal affect. Extremities: No clubbing or cyanosis.  HEENT: Normal.   Assessment/Plan: 1. Chronic systolic CHF: Nonischemic cardiomyopathy. Echo (11/20) with severe LVH and speckled pattern concerning for cardiac amyloidosis, EF 20%, moderately decreased RV systolic function.  LHC/RHC in 12/20 showed no significant CAD, filling pressures not elevated but cardiac output moderately depressed.  Appearance of myocardium is suggestive of amyloidosis, but this usually is not associated with a profound fall in EF. He does have symptoms that may be consistent with mild peripheral neuropathy.  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  Echo in 5/22 showed EF 35-40%, severe LVH, mildly decreased RV systolic function, mild RVH, mild AI, mild MR.  Chronic NYHA class II symptoms, improved since stopping Coreg.  Not volume overloaded on exam.  - Continue Entresto 97/103 bid. - He will stay off Coreg, feels much better off.   - Continue spironolactone, BMET today.   - He is not on Lasix.  - Continue Farxiga 10 mg daily.   - Continue to exercise regularly.  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  Has some symptoms of peripheral neuropathy.  Myocardial appearance on echo is suggestive of amyloidosis.  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  However, serum immunofixation did show a monoclonal IgA light chain.  I suspect that this is ATTR amyloidosis given the strong positivity of the PYP scan.  He was seen by hematology (Dr. Irene Limbo) given the monoclonal light chain.  He does not want aggressive workup of the monoclonal gammopathy (no biopsies, would not want chemotherapy if multiple myeloma were found).  Invitae gene testing was negative for the common ATTR mutations, suggesting wild type ATTR amyloidosis.  Dr. 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  - Continue tafamidis.   3. Depression: Mood  seems improved today.  Followup in 4 months.   Loralie Champagne 09/29/2021

## 2021-10-25 ENCOUNTER — Other Ambulatory Visit (HOSPITAL_COMMUNITY): Payer: Self-pay

## 2021-10-30 ENCOUNTER — Telehealth (HOSPITAL_COMMUNITY): Payer: Self-pay | Admitting: Pharmacy Technician

## 2021-10-30 ENCOUNTER — Other Ambulatory Visit (HOSPITAL_COMMUNITY): Payer: Self-pay

## 2021-10-30 NOTE — Telephone Encounter (Signed)
Patient Advocate Encounter   Received notification from [facility] that prior authorization for Vyndamax is required.   PA submitted on [phone] Reference # [id number] Status is pending   Will continue to follow.

## 2021-10-31 NOTE — Telephone Encounter (Signed)
Received notification from [facility] regarding a prior authorization for  [facility] . Authorization has been APPROVED from 10/30/21 to 09/28/22.   Document scanned into chart.  Phone # [phone]

## 2021-11-04 ENCOUNTER — Other Ambulatory Visit (HOSPITAL_COMMUNITY): Payer: Self-pay

## 2021-11-04 NOTE — Telephone Encounter (Signed)
Advanced Heart Failure Patient Advocate Encounter  Current 30 day supply for Vyndamax $2975 (through [facility]).I have called the patient several times with no way to leave a message. He in previous years has had both [facility] commercial plan through [employer] and a Medicare plan. We have been able to fill the Vyndamax with [facility] and a co-pay card. The [facility] is now saying that the patient does not have active benefits. Cannot use the co-pay card with Medicare.  I was able to get the patient on the phone today and tell him the information above. He is going to call and see why his [employer] benefits are stating that they are terminated.  Charlann Boxer, CPhT

## 2021-11-06 ENCOUNTER — Other Ambulatory Visit (HOSPITAL_COMMUNITY): Payer: Self-pay

## 2021-11-07 ENCOUNTER — Other Ambulatory Visit (HOSPITAL_COMMUNITY): Payer: Self-pay

## 2021-11-08 ENCOUNTER — Other Ambulatory Visit (HOSPITAL_COMMUNITY): Payer: Self-pay

## 2021-11-08 NOTE — Telephone Encounter (Signed)
Advanced Heart Failure Patient Advocate Encounter  Spoke with patient, [employer] benefits have been reinstated. The patient was informed that [facility] should be able to process the RX and copay card now. He stated that the medication will be delivered next week.  Charlann Boxer, CPhT

## 2021-12-24 DIAGNOSIS — L821 Other seborrheic keratosis: Secondary | ICD-10-CM | POA: Diagnosis not present

## 2021-12-24 DIAGNOSIS — Z85828 Personal history of other malignant neoplasm of skin: Secondary | ICD-10-CM | POA: Diagnosis not present

## 2021-12-24 DIAGNOSIS — D2272 Melanocytic nevi of left lower limb, including hip: Secondary | ICD-10-CM | POA: Diagnosis not present

## 2021-12-24 DIAGNOSIS — D1801 Hemangioma of skin and subcutaneous tissue: Secondary | ICD-10-CM | POA: Diagnosis not present

## 2021-12-24 DIAGNOSIS — L570 Actinic keratosis: Secondary | ICD-10-CM | POA: Diagnosis not present

## 2021-12-24 DIAGNOSIS — D2271 Melanocytic nevi of right lower limb, including hip: Secondary | ICD-10-CM | POA: Diagnosis not present

## 2022-01-07 ENCOUNTER — Other Ambulatory Visit (HOSPITAL_COMMUNITY): Payer: Self-pay | Admitting: *Deleted

## 2022-01-07 ENCOUNTER — Other Ambulatory Visit (HOSPITAL_COMMUNITY): Payer: Self-pay | Admitting: Cardiology

## 2022-01-07 MED ORDER — VYNDAMAX 61 MG PO CAPS: 1.0000 | ORAL_CAPSULE | Freq: Every day | ORAL | 11 refills | Status: DC

## 2022-02-18 DIAGNOSIS — M9901 Segmental and somatic dysfunction of cervical region: Secondary | ICD-10-CM | POA: Diagnosis not present

## 2022-02-18 DIAGNOSIS — M9902 Segmental and somatic dysfunction of thoracic region: Secondary | ICD-10-CM | POA: Diagnosis not present

## 2022-02-18 DIAGNOSIS — S138XXA Sprain of joints and ligaments of other parts of neck, initial encounter: Secondary | ICD-10-CM | POA: Diagnosis not present

## 2022-02-18 DIAGNOSIS — S29012A Strain of muscle and tendon of back wall of thorax, initial encounter: Secondary | ICD-10-CM | POA: Diagnosis not present

## 2022-03-18 ENCOUNTER — Other Ambulatory Visit (HOSPITAL_COMMUNITY): Payer: Self-pay | Admitting: Cardiology

## 2022-04-02 ENCOUNTER — Telehealth (HOSPITAL_COMMUNITY): Payer: Self-pay | Admitting: Pharmacy Technician

## 2022-04-02 DIAGNOSIS — E859 Amyloidosis, unspecified: Secondary | ICD-10-CM | POA: Diagnosis not present

## 2022-04-02 DIAGNOSIS — I10 Essential (primary) hypertension: Secondary | ICD-10-CM | POA: Diagnosis not present

## 2022-04-02 DIAGNOSIS — Z1331 Encounter for screening for depression: Secondary | ICD-10-CM | POA: Diagnosis not present

## 2022-04-02 DIAGNOSIS — I509 Heart failure, unspecified: Secondary | ICD-10-CM | POA: Diagnosis not present

## 2022-04-02 DIAGNOSIS — F4321 Adjustment disorder with depressed mood: Secondary | ICD-10-CM | POA: Diagnosis not present

## 2022-04-02 DIAGNOSIS — K624 Stenosis of anus and rectum: Secondary | ICD-10-CM | POA: Diagnosis not present

## 2022-04-02 DIAGNOSIS — R0601 Orthopnea: Secondary | ICD-10-CM | POA: Diagnosis not present

## 2022-04-02 DIAGNOSIS — R0602 Shortness of breath: Secondary | ICD-10-CM | POA: Diagnosis not present

## 2022-04-02 DIAGNOSIS — Z0000 Encounter for general adult medical examination without abnormal findings: Secondary | ICD-10-CM | POA: Diagnosis not present

## 2022-04-02 DIAGNOSIS — D81818 Other biotin-dependent carboxylase deficiency: Secondary | ICD-10-CM | POA: Diagnosis not present

## 2022-04-02 NOTE — Telephone Encounter (Signed)
Patient Advocate Encounter   Received notification from [facility] that prior authorization for Vyndamax is required.   PA submitted over the phone at [id number]. Chart notes were submitted via efax to [phone]  Reference # [id number] Status is pending   Will continue to follow.

## 2022-04-03 ENCOUNTER — Other Ambulatory Visit (HOSPITAL_COMMUNITY): Payer: Self-pay

## 2022-04-04 NOTE — Telephone Encounter (Signed)
Advanced Heart Failure Patient Advocate Encounter  Prior Authorization for Corey Harrison has been approved.    PA# [id number] Effective dates: 04/02/22 through 04/03/23  Patient has co-pay card to fill the medication at [facility].   Document scanned to chart.   Charlann Boxer, CPhT

## 2022-04-15 DIAGNOSIS — H35371 Puckering of macula, right eye: Secondary | ICD-10-CM | POA: Diagnosis not present

## 2022-04-15 DIAGNOSIS — H52203 Unspecified astigmatism, bilateral: Secondary | ICD-10-CM | POA: Diagnosis not present

## 2022-04-15 DIAGNOSIS — Z961 Presence of intraocular lens: Secondary | ICD-10-CM | POA: Diagnosis not present

## 2022-05-21 ENCOUNTER — Ambulatory Visit (HOSPITAL_COMMUNITY)
Admission: RE | Admit: 2022-05-21 | Discharge: 2022-05-21 | Disposition: A | Discharge status: Home / Self Care | Payer: Medicare Other | Source: Ambulatory Visit | Attending: Cardiology | Admitting: Cardiology

## 2022-05-21 ENCOUNTER — Encounter (HOSPITAL_COMMUNITY): Payer: Self-pay | Admitting: Cardiology

## 2022-05-21 VITALS — BP 110/70 | HR 82 | Wt 153.8 lb

## 2022-05-21 DIAGNOSIS — Z7984 Long term (current) use of oral hypoglycemic drugs: Secondary | ICD-10-CM | POA: Insufficient documentation

## 2022-05-21 DIAGNOSIS — Z79899 Other long term (current) drug therapy: Secondary | ICD-10-CM | POA: Diagnosis not present

## 2022-05-21 DIAGNOSIS — E782 Mixed hyperlipidemia: Secondary | ICD-10-CM | POA: Diagnosis not present

## 2022-05-21 DIAGNOSIS — R2681 Unsteadiness on feet: Secondary | ICD-10-CM | POA: Diagnosis not present

## 2022-05-21 DIAGNOSIS — E785 Hyperlipidemia, unspecified: Secondary | ICD-10-CM | POA: Insufficient documentation

## 2022-05-21 DIAGNOSIS — I5022 Chronic systolic (congestive) heart failure: Secondary | ICD-10-CM | POA: Diagnosis not present

## 2022-05-21 DIAGNOSIS — R202 Paresthesia of skin: Secondary | ICD-10-CM | POA: Diagnosis not present

## 2022-05-21 DIAGNOSIS — I428 Other cardiomyopathies: Secondary | ICD-10-CM | POA: Insufficient documentation

## 2022-05-21 DIAGNOSIS — I110 Hypertensive heart disease with heart failure: Secondary | ICD-10-CM | POA: Diagnosis not present

## 2022-05-21 DIAGNOSIS — R5383 Other fatigue: Secondary | ICD-10-CM | POA: Insufficient documentation

## 2022-05-21 DIAGNOSIS — R0602 Shortness of breath: Secondary | ICD-10-CM | POA: Insufficient documentation

## 2022-05-21 DIAGNOSIS — F32A Depression, unspecified: Secondary | ICD-10-CM | POA: Diagnosis not present

## 2022-05-21 LAB — BASIC METABOLIC PANEL
Anion gap: 10 (ref 5–15)
BUN: 54 mg/dL — ABNORMAL HIGH (ref 8–23)
CO2: 20 mmol/L — ABNORMAL LOW (ref 22–32)
Calcium: 9.6 mg/dL (ref 8.9–10.3)
Chloride: 109 mmol/L (ref 98–111)
Creatinine, Ser: 1.77 mg/dL — ABNORMAL HIGH (ref 0.61–1.24)
GFR, Estimated: 36 mL/min — ABNORMAL LOW (ref >60)
Glucose, Bld: 107 mg/dL — ABNORMAL HIGH (ref 70–99)
Potassium: 4.9 mmol/L (ref 3.5–5.1)
Sodium: 139 mmol/L (ref 135–145)

## 2022-05-21 LAB — LIPID PANEL
Cholesterol: 161 mg/dL (ref 0–200)
HDL: 74 mg/dL (ref >40)
LDL Cholesterol: 82 mg/dL (ref 0–99)
Total CHOL/HDL Ratio: 2.2 RATIO
Triglycerides: 24 mg/dL (ref <150)
VLDL: 5 mg/dL (ref 0–40)

## 2022-05-21 LAB — BRAIN NATRIURETIC PEPTIDE: B Natriuretic Peptide: 315 pg/mL — ABNORMAL HIGH (ref 0.0–100.0)

## 2022-05-21 NOTE — Progress Notes (Signed)
PCP: Dr. Inda Merlin Cardiology: Dr. Aundra Dubin  88 y.o. returns for followup of CHF.  I took care of his wife in the past.  He had been generally health up until this fall.  He developed worsening fatigue as well as exertional dyspnea.  His PCP started him on Lasix 20 mg daily, which helped his breathing.  Echo was done (cannot review as done at non-[facility] facility) showing severe LVH with a speckled pattern concerning for amyloidosis.  Also noted was EF of 20%, diffuse hypokinesis, and moderate RV dysfunction.  LHC/RHC was done in 12/20, showing no significant CAD and optimized filling pressures with low cardiac output.    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    He saw Dr. Irene Limbo for evaluation of monoclonal gammopathy.  He would not want bone marrow biopsy or endomyocardial biopsy, and he would not want chemotherapy if multiple myeloma were found.  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   Echo in 5/22 showed EF 35-40%, severe LVH, mildly decreased RV systolic function, mild RVH, mild AI, mild MR.   He reports that he is gradually getting more fatigued with exertion, not much change since last appointment but gradually worsening.  He still walks 20-30 minutes/day.  He gets short of breath walking more than about 5 minutes but he still does it. He uses light weights.  He has been using a cane given some problems with balance.  No falls.  Mild tingling in his feet, no worse than in the past.  Weight is down 5 lbs.   ECG (personally reviewed): NSR, LBBB 130 msec, PVCs  Labs (12/20): K 5.2 => 4.9, creatinine 1.26 => 1.32, BNP 628, hgb 14.4.  Serum immunofixation with IgA monoclonal protein (light chain).  Urine immunofixation negative.  Labs (1/21): K 5.2 => 4.5, creatinine 1.31 => 1.26 Labs (3/21): K 4.6, creatinine 1.44 Labs (5/21): K 4.8, creatinine 1.38 Labs (7/21): K 5, creatinine 1.74 Labs (2/22): K 5.1,  creatinine 1.56 Labs (5/22): K 4.3, creatinine 1.24 Labs (8/22): BNP 667, K 4.7, creatinine 1.42 Labs (12/22): K 5.1, creatinine 1.48  PMH: 1. HTN 2. Hyperlipidemia 3. Prostate cancer 4. Osteoarthritis 5. Macular degeneration.  6. Chronic systolic CHF:  - Echo (87/68): EF 55-60%, normal RV.  - Echo (11/20): Severe LVH with speckled pattern concerning for amyloidosis, EF 20% with diffuse hypokinesis, moderately decreased RV systolic function.  - LHC/RHC (12/20): No significant CAD.  Mean RA 2, PA 36/15, mean PCWP 14, CI 2.06.  - PYP scan: grade 3, H/CL ratio 2.8. Strongly suggestive of transthyretin amyloidosis.  Invitae gene testing negative for typical ATTR mutations, suggesting wild-type ATTR.  - Echo (5/22): EF 35-40%, severe LVH, mildly decreased RV systolic function, mild RVH, mild AI, mild MR.  7. Monoclonal gammopathy 8. LBBB  SH: Widower, retired, lives in [place].  Nonsmoker, occasional ETOH (not heavy).   FH: No history of premature CAD or cardiomyopathy.   ROS: All systems reviewed and negative except as per HPI.   Current Outpatient Medications  Medication Sig Dispense Refill   ENTRESTO 97-103 MG TAKE 1 TABLET BY MOUTH TWICE DAILY. 60 tablet 11   FARXIGA 10 MG TABS tablet TAKE 1 TABLET ONCE DAILY BEFORE BREAKFAST. 30 tablet 11   Glucosamine 500 MG CAPS Take 500 mg by mouth daily.      Melatonin 5 MG TABS Take 5 mg by mouth daily.      Multiple Vitamin (  MULTIVITAMIN WITH MINERALS) TABS tablet Take 1 tablet by mouth at bedtime.     simvastatin (ZOCOR) 20 MG tablet Take 20 mg by mouth at bedtime.     spironolactone (ALDACTONE) 25 MG tablet TAKE 1 TABLET ONCE DAILY. 30 tablet 11   Tafamidis (VYNDAMAX) 61 MG CAPS Take 1 capsule by mouth daily. 30 capsule 11   No current facility-administered medications for this encounter.   BP 110/70   Pulse 82   Wt 69.8 kg (153 lb 12.8 oz)   SpO2 96%   BMI 22.71 kg/m  General: NAD Neck: No JVD, no thyromegaly or thyroid  nodule.  Lungs: Clear to auscultation bilaterally with normal respiratory effort. CV: Nondisplaced PMI.  Heart regular S1/S2, no S3/S4, no murmur.  No peripheral edema.  No carotid bruit.  Normal pedal pulses.  Abdomen: Soft, nontender, no hepatosplenomegaly, no distention.  Skin: Intact without lesions or rashes.  Neurologic: Alert and oriented x 3.  Psych: Normal affect. Extremities: No clubbing or cyanosis.  HEENT: Normal.   Assessment/Plan: 1. Chronic systolic CHF: Nonischemic cardiomyopathy. Echo (11/20) with severe LVH and speckled pattern concerning for cardiac amyloidosis, EF 20%, moderately decreased RV systolic function.  LHC/RHC in 12/20 showed no significant CAD, filling pressures not elevated but cardiac output moderately depressed.  Appearance of myocardium is suggestive of amyloidosis, but this usually is not associated with a profound fall in EF. He does have symptoms that may be consistent with mild peripheral neuropathy.  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  Echo in 5/22 showed EF 35-40%, severe LVH, mildly decreased RV systolic function, mild RVH, mild AI, mild MR.  Chronic NYHA class II-III symptoms, gradually/slowly gets worse over time.  Not volume overloaded on exam.  - Continue Entresto 97/103 bid. - He will stay off Coreg, feels much better off.   - Continue spironolactone, BMET/BNP today.   - He is not on Lasix.  - Continue Farxiga 10 mg daily.   - Continue to exercise regularly.  - I will arrange for repeat echo.  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  Has some symptoms of peripheral neuropathy (tingling in his feet and some balance difficulty).  Myocardial appearance on echo is suggestive of amyloidosis.  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  However, serum immunofixation did show a monoclonal IgA light chain.  I suspect that this is ATTR amyloidosis given the strong positivity of the PYP scan.  He was seen by hematology  (Dr. Irene Limbo) given the monoclonal light chain.  He does not want aggressive workup of the monoclonal gammopathy (no biopsies, would not want chemotherapy if multiple myeloma were found).  Invitae gene testing was negative for the common ATTR mutations, suggesting wild type ATTR amyloidosis.  Dr. 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  - Continue tafamidis.   3. Depression: He has not wanted pharmacologic treatment.   Followup in 4 months.   Loralie Champagne 05/21/2022

## 2022-05-21 NOTE — Patient Instructions (Signed)
There has been no changes to your medications   Labs done today, your results will be available in MyChart, we will contact you for abnormal readings.  Your physician has requested that you have an echocardiogram. Echocardiography is a painless test that uses sound waves to create images of your heart. It provides your doctor with information about the size and shape of your heart and how well your heart's chambers and valves are working. This procedure takes approximately one hour. There are no restrictions for this procedure.  Your physician recommends that you schedule a follow-up appointment in: 4 months.  If you have any questions or concerns before your next appointment please send [place] a message through [place] or call our office at [phone].    TO LEAVE A MESSAGE FOR THE NURSE SELECT OPTION 2, PLEASE LEAVE A MESSAGE INCLUDING: YOUR NAME DATE OF BIRTH CALL BACK NUMBER REASON FOR CALL**this is important as we prioritize the call backs  YOU WILL RECEIVE A CALL BACK THE SAME DAY AS LONG AS YOU CALL BEFORE 4:00 PM  At the [facility], you and your health needs are our priority. As part of our continuing mission to provide you with exceptional heart care, we have created designated Provider Care Teams. These Care Teams include your primary Cardiologist (physician) and Advanced Practice Providers (APPs- Physician Assistants and Nurse Practitioners) who all work together to provide you with the care you need, when you need it.   You may see any of the following providers on your designated Care Team at your next follow up: Dr Glori Bickers Dr Haynes Kerns, NP Lyda Jester, [place] [facility] [place], [place] Audry Riles, PharmD   Please be sure to bring in all your medications bottles to every appointment.

## 2022-05-26 ENCOUNTER — Encounter (HOSPITAL_COMMUNITY): Payer: Self-pay

## 2022-06-04 ENCOUNTER — Telehealth (HOSPITAL_COMMUNITY): Payer: Self-pay | Admitting: *Deleted

## 2022-06-04 NOTE — Telephone Encounter (Signed)
Emer Lilia Pro, RN  05/26/2022 11:24 AM EDT Back to Top    Letter sent    Rockwell Alexandria, [facility]  05/23/2022  3:46 PM EDT     Tried calling patient however was unable to reach anyone   Kerry Dory, [facility]  05/22/2022  9:49 AM EDT     Patient called.  Unable to reach patient. No answer home number, unable to leave message. No answer cell number, no voice mail    Rockwell Alexandria, [facility]  05/21/2022  3:21 PM EDT     Tried calling patient however, I was unable to leave a voice message.   Larey Dresser, MD  05/21/2022  2:21 PM EDT     Needs to stay better hydrated.  Increase po fluid intake.  Repeat BMET in 2-3 weeks to make sure BUN/creatinine lower.   Pt returned call and is aware of results.

## 2022-06-28 ENCOUNTER — Other Ambulatory Visit (HOSPITAL_COMMUNITY): Payer: Self-pay | Admitting: Cardiology

## 2022-07-01 ENCOUNTER — Other Ambulatory Visit (HOSPITAL_BASED_OUTPATIENT_CLINIC_OR_DEPARTMENT_OTHER): Payer: Self-pay

## 2022-07-01 MED ORDER — FLUAD QUADRIVALENT 0.5 ML IM PRSY
PREFILLED_SYRINGE | INTRAMUSCULAR | 0 refills | Status: DC
  Filled 2022-07-01: qty 0.5, 1d supply, fill #0

## 2022-07-07 DIAGNOSIS — L570 Actinic keratosis: Secondary | ICD-10-CM | POA: Diagnosis not present

## 2022-07-07 DIAGNOSIS — D2261 Melanocytic nevi of right upper limb, including shoulder: Secondary | ICD-10-CM | POA: Diagnosis not present

## 2022-07-07 DIAGNOSIS — D2262 Melanocytic nevi of left upper limb, including shoulder: Secondary | ICD-10-CM | POA: Diagnosis not present

## 2022-07-07 DIAGNOSIS — L814 Other melanin hyperpigmentation: Secondary | ICD-10-CM | POA: Diagnosis not present

## 2022-07-07 DIAGNOSIS — Z85828 Personal history of other malignant neoplasm of skin: Secondary | ICD-10-CM | POA: Diagnosis not present

## 2022-07-07 DIAGNOSIS — L821 Other seborrheic keratosis: Secondary | ICD-10-CM | POA: Diagnosis not present

## 2022-07-07 DIAGNOSIS — D2271 Melanocytic nevi of right lower limb, including hip: Secondary | ICD-10-CM | POA: Diagnosis not present

## 2022-07-07 DIAGNOSIS — D2272 Melanocytic nevi of left lower limb, including hip: Secondary | ICD-10-CM | POA: Diagnosis not present

## 2022-07-07 DIAGNOSIS — D225 Melanocytic nevi of trunk: Secondary | ICD-10-CM | POA: Diagnosis not present

## 2022-08-20 DIAGNOSIS — S29012A Strain of muscle and tendon of back wall of thorax, initial encounter: Secondary | ICD-10-CM | POA: Diagnosis not present

## 2022-08-20 DIAGNOSIS — S138XXA Sprain of joints and ligaments of other parts of neck, initial encounter: Secondary | ICD-10-CM | POA: Diagnosis not present

## 2022-08-20 DIAGNOSIS — M9902 Segmental and somatic dysfunction of thoracic region: Secondary | ICD-10-CM | POA: Diagnosis not present

## 2022-08-20 DIAGNOSIS — M9901 Segmental and somatic dysfunction of cervical region: Secondary | ICD-10-CM | POA: Diagnosis not present

## 2022-09-01 ENCOUNTER — Encounter (HOSPITAL_COMMUNITY): Payer: Self-pay | Admitting: Cardiology

## 2022-09-01 ENCOUNTER — Ambulatory Visit (HOSPITAL_BASED_OUTPATIENT_CLINIC_OR_DEPARTMENT_OTHER)
Admission: RE | Admit: 2022-09-01 | Discharge: 2022-09-01 | Disposition: A | Discharge status: Home / Self Care | Payer: Medicare Other | Source: Ambulatory Visit | Attending: Cardiology | Admitting: Cardiology

## 2022-09-01 ENCOUNTER — Ambulatory Visit (HOSPITAL_COMMUNITY)
Admission: RE | Admit: 2022-09-01 | Discharge: 2022-09-01 | Disposition: A | Discharge status: Home / Self Care | Payer: Medicare Other | Source: Ambulatory Visit | Attending: Cardiology | Admitting: Cardiology

## 2022-09-01 VITALS — BP 128/70 | HR 74 | Wt 154.4 lb

## 2022-09-01 DIAGNOSIS — I5022 Chronic systolic (congestive) heart failure: Secondary | ICD-10-CM | POA: Insufficient documentation

## 2022-09-01 DIAGNOSIS — I080 Rheumatic disorders of both mitral and aortic valves: Secondary | ICD-10-CM | POA: Diagnosis not present

## 2022-09-01 DIAGNOSIS — I110 Hypertensive heart disease with heart failure: Secondary | ICD-10-CM | POA: Diagnosis not present

## 2022-09-01 DIAGNOSIS — Z79899 Other long term (current) drug therapy: Secondary | ICD-10-CM | POA: Diagnosis not present

## 2022-09-01 DIAGNOSIS — I440 Atrioventricular block, first degree: Secondary | ICD-10-CM | POA: Insufficient documentation

## 2022-09-01 DIAGNOSIS — Z8546 Personal history of malignant neoplasm of prostate: Secondary | ICD-10-CM | POA: Diagnosis not present

## 2022-09-01 DIAGNOSIS — E859 Amyloidosis, unspecified: Secondary | ICD-10-CM | POA: Diagnosis not present

## 2022-09-01 DIAGNOSIS — F32A Depression, unspecified: Secondary | ICD-10-CM | POA: Diagnosis not present

## 2022-09-01 DIAGNOSIS — I447 Left bundle-branch block, unspecified: Secondary | ICD-10-CM | POA: Insufficient documentation

## 2022-09-01 DIAGNOSIS — I428 Other cardiomyopathies: Secondary | ICD-10-CM | POA: Insufficient documentation

## 2022-09-01 LAB — BASIC METABOLIC PANEL
Anion gap: 8 (ref 5–15)
BUN: 52 mg/dL — ABNORMAL HIGH (ref 8–23)
CO2: 20 mmol/L — ABNORMAL LOW (ref 22–32)
Calcium: 9.3 mg/dL (ref 8.9–10.3)
Chloride: 108 mmol/L (ref 98–111)
Creatinine, Ser: 1.71 mg/dL — ABNORMAL HIGH (ref 0.61–1.24)
GFR, Estimated: 37 mL/min — ABNORMAL LOW (ref >60)
Glucose, Bld: 87 mg/dL (ref 70–99)
Potassium: 4.6 mmol/L (ref 3.5–5.1)
Sodium: 136 mmol/L (ref 135–145)

## 2022-09-01 LAB — ECHOCARDIOGRAM COMPLETE
Area-P 1/2: 2.64 cm2
Calc EF: 36.7 %
MV M vel: 2.85 m/s
MV Peak grad: 32.5 mmHg
MV VTI: 2.14 cm2
P 1/2 time: 696 msec
Radius: 0.3 cm
S' Lateral: 2.9 cm
Single Plane A2C EF: 34.3 %
Single Plane A4C EF: 38.2 %

## 2022-09-01 LAB — BRAIN NATRIURETIC PEPTIDE: B Natriuretic Peptide: 507.1 pg/mL — ABNORMAL HIGH (ref 0.0–100.0)

## 2022-09-01 NOTE — Progress Notes (Signed)
  Echocardiogram 2D Echocardiogram has been performed.  Eartha Inch 09/01/2022, 10:01 AM

## 2022-09-01 NOTE — Patient Instructions (Addendum)
No changes to your medications.  Labs done today, your results will be available in MyChart, we will contact you for abnormal readings.  Your physician recommends that you schedule a follow-up appointment in: 3 months  If you have any questions or concerns before your next appointment please send [place] a message through [place] or call our office at [phone].    TO LEAVE A MESSAGE FOR THE NURSE SELECT OPTION 2, PLEASE LEAVE A MESSAGE INCLUDING: YOUR NAME DATE OF BIRTH CALL BACK NUMBER REASON FOR CALL**this is important as we prioritize the call backs  YOU WILL RECEIVE A CALL BACK THE SAME DAY AS LONG AS YOU CALL BEFORE 4:00 PM  At the [facility], you and your health needs are our priority. As part of our continuing mission to provide you with exceptional heart care, we have created designated Provider Care Teams. These Care Teams include your primary Cardiologist (physician) and Advanced Practice Providers (APPs- Physician Assistants and Nurse Practitioners) who all work together to provide you with the care you need, when you need it.   You may see any of the following providers on your designated Care Team at your next follow up: Dr Glori Bickers Dr Loralie Champagne Dr. Roxana Hires, NP Lyda Jester, [place] [facility], [place] Forestine Na, NP Audry Riles, PharmD   Please be sure to bring in all your medications bottles to every appointment.

## 2022-09-01 NOTE — Progress Notes (Signed)
PCP: Dr. Inda Merlin Cardiology: Dr. Aundra Dubin  88 y.o. returns for followup of CHF.  I took care of his wife in the past.  He had been generally health up until this fall.  He developed worsening fatigue as well as exertional dyspnea.  His PCP started him on Lasix 20 mg daily, which helped his breathing.  Echo was done (cannot review as done at non-[facility] facility) showing severe LVH with a speckled pattern concerning for amyloidosis.  Also noted was EF of 20%, diffuse hypokinesis, and moderate RV dysfunction.  LHC/RHC was done in 12/20, showing no significant CAD and optimized filling pressures with low cardiac output.    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    He saw Dr. Irene Limbo for evaluation of monoclonal gammopathy.  He would not want bone marrow biopsy or endomyocardial biopsy, and he would not want chemotherapy if multiple myeloma were found.  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   Echo in 5/22 showed EF 35-40%, severe LVH, mildly decreased RV systolic function, mild RVH, mild AI, mild MR.  Echo was done today and reviewed, EF 35-40%, severe LVH, diffuse hypokinesis with apical sparing strain pattern consistent with amyloidosis, mildly decreased RV systolic function, mild MR, IVC normal.   He remains stably symptomatic.  He walks 15-30 minutes/day and works out with dumbbells.  He fatigues easily.  Some days are better than others. No lightheadedness/dizziness but has some trouble with balance.  Weight is stable. He has some dyspnea with longer walks and hills/inclines.   ECG (personally reviewed): NSR, NSR, 1st degree AVB 272 msec, LBBB 134 msec  Labs (12/20): K 5.2 => 4.9, creatinine 1.26 => 1.32, BNP 628, hgb 14.4.  Serum immunofixation with IgA monoclonal protein (light chain).  Urine immunofixation negative.  Labs (1/21): K 5.2 => 4.5, creatinine 1.31 => 1.26 Labs (3/21): K 4.6, creatinine  1.44 Labs (5/21): K 4.8, creatinine 1.38 Labs (7/21): K 5, creatinine 1.74 Labs (2/22): K 5.1, creatinine 1.56 Labs (5/22): K 4.3, creatinine 1.24 Labs (8/22): BNP 667, K 4.7, creatinine 1.42 Labs (12/22): K 5.1, creatinine 1.48 Labs (8/23): BNP 315, LDL 82, K 4.9, creatinine 1.77  PMH: 1. HTN 2. Hyperlipidemia 3. Prostate cancer 4. Osteoarthritis 5. Macular degeneration.  6. Chronic systolic CHF:  - Echo (81/85): EF 55-60%, normal RV.  - Echo (11/20): Severe LVH with speckled pattern concerning for amyloidosis, EF 20% with diffuse hypokinesis, moderately decreased RV systolic function.  - LHC/RHC (12/20): No significant CAD.  Mean RA 2, PA 36/15, mean PCWP 14, CI 2.06.  - PYP scan: grade 3, H/CL ratio 2.8. Strongly suggestive of transthyretin amyloidosis.  Invitae gene testing negative for typical ATTR mutations, suggesting wild-type ATTR.  - Echo (5/22): EF 35-40%, severe LVH, mildly decreased RV systolic function, mild RVH, mild AI, mild MR.  - Echo (12/23): EF 35-40%, severe LVH, diffuse hypokinesis with apical sparing strain pattern consistent with amyloidosis, mildly decreased RV systolic function, mild MR, IVC normal.  7. Monoclonal gammopathy 8. LBBB  SH: Widower, retired, lives in [place].  Nonsmoker, occasional ETOH (not heavy).   FH: No history of premature CAD or cardiomyopathy.   ROS: All systems reviewed and negative except as per HPI.   Current Outpatient Medications  Medication Sig Dispense Refill   ENTRESTO 97-103 MG TAKE 1 TABLET BY MOUTH TWICE DAILY. 60 tablet 11   FARXIGA 10 MG TABS tablet TAKE 1 TABLET ONCE DAILY BEFORE  BREAKFAST. 30 tablet 11   Glucosamine 500 MG CAPS Take 500 mg by mouth daily.      Melatonin 10 MG CAPS Take 10 mg by mouth daily.     Multiple Vitamin (MULTIVITAMIN WITH MINERALS) TABS tablet Take 1 tablet by mouth at bedtime.     simvastatin (ZOCOR) 20 MG tablet Take 20 mg by mouth at bedtime.     spironolactone (ALDACTONE) 25 MG tablet  TAKE 1 TABLET ONCE DAILY. 90 tablet 3   Tafamidis (VYNDAMAX) 61 MG CAPS Take 1 capsule by mouth daily. 30 capsule 11   No current facility-administered medications for this encounter.   BP 128/70   Pulse 74   Wt 70 kg (154 lb 6.4 oz)   SpO2 98%   BMI 22.80 kg/m  General: NAD Neck: No JVD, no thyromegaly or thyroid nodule.  Lungs: Clear to auscultation bilaterally with normal respiratory effort. CV: Nondisplaced PMI.  Heart regular S1/S2, no S3/S4, no murmur.  No peripheral edema.  No carotid bruit.  Normal pedal pulses.  Abdomen: Soft, nontender, no hepatosplenomegaly, no distention.  Skin: Intact without lesions or rashes.  Neurologic: Alert and oriented x 3.  Psych: Normal affect. Extremities: No clubbing or cyanosis.  HEENT: Normal.   Assessment/Plan: 1. Chronic systolic CHF: Nonischemic cardiomyopathy. Echo (11/20) with severe LVH and speckled pattern concerning for cardiac amyloidosis, EF 20%, moderately decreased RV systolic function.  LHC/RHC in 12/20 showed no significant CAD, filling pressures not elevated but cardiac output moderately depressed.  Appearance of myocardium is suggestive of amyloidosis, but this usually is not associated with a profound fall in EF. He does have symptoms that may be consistent with mild peripheral neuropathy.  PYP scan was strongly suggestive of ATTR cardiac amyloidosis.  Echo in 5/22 showed EF 35-40%, severe LVH, mildly decreased RV systolic function, mild RVH, mild AI, mild MR.  Echo today showed EF 35-40%, severe LVH, diffuse hypokinesis with apical sparing strain pattern consistent with amyloidosis, mildly decreased RV systolic function, mild MR, IVC normal. Chronic NYHA class II-III symptoms, no change.  Not volume overloaded on exam.  - Continue Entresto 97/103 bid. - He will stay off Coreg, feels much better off.   - Continue spironolactone, BMET/BNP today.   - He is not on Lasix.  - Continue Farxiga 10 mg daily.   - Continue to exercise  regularly.  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  Has some symptoms of peripheral neuropathy (tingling in his feet and some balance difficulty).  Myocardial appearance on echo is suggestive of amyloidosis.  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  However, serum immunofixation did show a monoclonal IgA light chain.  I suspect that this is ATTR amyloidosis given the strong positivity of the PYP scan.  He was seen by hematology (Dr. Irene Limbo) given the monoclonal light chain.  He does not want aggressive workup of the monoclonal gammopathy (no biopsies, would not want chemotherapy if multiple myeloma were found).  Invitae gene testing was negative for the common ATTR mutations, suggesting wild type ATTR amyloidosis.  Dr. 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  - Continue tafamidis.   3. Depression: He has not wanted pharmacologic treatment.  4. 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  He has had no symptoms of clinicallyl significant bradycardia.   Followup in 4 months with APP.   Loralie Champagne 09/01/2022

## 2022-09-02 ENCOUNTER — Telehealth (HOSPITAL_COMMUNITY): Payer: Self-pay

## 2022-09-02 NOTE — Telephone Encounter (Signed)
Patient aware and agreeable. 

## 2022-10-08 DIAGNOSIS — E782 Mixed hyperlipidemia: Secondary | ICD-10-CM | POA: Diagnosis not present

## 2022-10-08 DIAGNOSIS — I10 Essential (primary) hypertension: Secondary | ICD-10-CM | POA: Diagnosis not present

## 2022-10-08 DIAGNOSIS — I509 Heart failure, unspecified: Secondary | ICD-10-CM | POA: Diagnosis not present

## 2022-10-08 DIAGNOSIS — F4321 Adjustment disorder with depressed mood: Secondary | ICD-10-CM | POA: Diagnosis not present

## 2022-10-08 DIAGNOSIS — E859 Amyloidosis, unspecified: Secondary | ICD-10-CM | POA: Diagnosis not present

## 2022-10-08 DIAGNOSIS — M1990 Unspecified osteoarthritis, unspecified site: Secondary | ICD-10-CM | POA: Diagnosis not present

## 2022-10-28 ENCOUNTER — Other Ambulatory Visit (HOSPITAL_COMMUNITY): Payer: Self-pay

## 2022-11-13 ENCOUNTER — Telehealth (HOSPITAL_COMMUNITY): Payer: Self-pay | Admitting: Pharmacy Technician

## 2022-11-13 NOTE — Telephone Encounter (Signed)
Advanced Heart Failure Patient Advocate Encounter  Patient's daughter left vm about concerns with filling the patient's Vyndamax. Stated that the pharmacy was waiting for a prior authorization. Wanted to know if he was going to have to switch to Vyndaqel. The patient is dual covered with [facility] private and [employer]. He typically uses the [facility] plan so he can use co-pay cards.  Called and spoke with the patient's daughter. Explained that although [employer] medicare prefers Vyndaqel, he shouldn't need a PA for Vyndamax through [facility] at this time. His PA through [facility] gets renewed every July. Advised her to call the pharmacy and ask them to use [employer] instead of [employer]. If he is not actively using [employer] to fill his medications, I would ask the pharmacy to delete that plan out completely.  Advised her to call back with issues.  Charlann Boxer, CPhT

## 2022-11-17 ENCOUNTER — Telehealth (HOSPITAL_COMMUNITY): Payer: Self-pay | Admitting: Pharmacy Technician

## 2022-11-17 ENCOUNTER — Other Ambulatory Visit (HOSPITAL_COMMUNITY): Payer: Self-pay

## 2022-11-17 NOTE — Telephone Encounter (Signed)
Advanced Heart Failure Patient Advocate Encounter  Patient left vm asking for help with Vyndamax. Called [facility]. They are trying to run the patient's [employer]. The patient has always told me in the past that he prefers to use [facility] over his Medicare [facility] plan. Provided plan information and copay card to pharmacy. They will have to call the copay card for an override due to the patient's age.   Called and updated the patient. He is not sure which plan is best to use but wants to use the plan that will lead to a $0 copay. I think the [facility] is the best course of action since its worked in the past. Will follow up with pharmacy tomorrow.

## 2022-11-18 ENCOUNTER — Other Ambulatory Visit (HOSPITAL_COMMUNITY): Payer: Self-pay

## 2022-11-20 ENCOUNTER — Telehealth (HOSPITAL_COMMUNITY): Payer: Self-pay | Admitting: Pharmacy Technician

## 2022-11-20 ENCOUNTER — Other Ambulatory Visit (HOSPITAL_COMMUNITY): Payer: Self-pay

## 2022-11-20 NOTE — Telephone Encounter (Signed)
Patient Advocate Encounter   Received notification from [facility] that prior authorization for Vyndamax is required.   PA submitted on CoverMyMeds Key [id number] Status is pending   Will continue to follow.

## 2022-11-20 NOTE — Telephone Encounter (Signed)
Advanced Heart Failure Patient Advocate Encounter  Have [facility] a few times, they have had several issues getting the copay card override. Was able to get [facility] PA approved and will proceed with that insurance instead.  Charlann Boxer, CPhT

## 2022-11-20 NOTE — Telephone Encounter (Signed)
Advanced Heart Failure Patient Advocate Encounter  Prior Authorization for [employer] has been approved.    PA# [id number] Effective dates: 09/29/22 through 09/29/23  Patients co-pay is $0  Called and spoke with the patient.   Charlann Boxer, CPhT

## 2022-12-02 NOTE — Progress Notes (Signed)
Advanced Heart Failure Clinic Progress Note   PCP: Dr. Inda Merlin Cardiology: Dr. Aundra Dubin  89 y.o. returns for followup of CHF.  I took care of his wife in the past.  Corey had been generally health up until this fall.  Corey developed worsening fatigue as well as exertional dyspnea.  His PCP started him on Lasix 20 mg daily, which helped his breathing.  Echo was done (cannot review as done at non-[facility] facility) showing severe LVH with a speckled pattern concerning for amyloidosis.  Also noted was EF of 20%, diffuse hypokinesis, and moderate RV dysfunction.  LHC/RHC was done in 12/20, showing no significant CAD and optimized filling pressures with low cardiac output.    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    Corey saw Dr. Irene Limbo for evaluation of monoclonal gammopathy.  Corey would not want bone marrow biopsy or endomyocardial biopsy, and Corey would not want chemotherapy if multiple myeloma were found.  Dr. Irene Limbo agreed that Corey likely has ATTR amyloidosis.   Echo in 5/22 showed EF 35-40%, severe LVH, mildly decreased RV systolic function, mild RVH, mild AI, mild MR.   Echo 12/23, EF 35-40%, severe LVH, diffuse hypokinesis with apical sparing strain pattern consistent with amyloidosis, mildly decreased RV systolic function, mild MR, IVC normal.   Corey presents today for 3 month f/u. Reports stable NYHA Class II-early III symptoms. Walks and works out w/ dumbbells for exercise. Notes occasional positional dizziness if Corey stands too quickly but no syncope/ near syncope. BP 138/76. Pulse rate 70 bpm. Wt stable since last OV, down 3 lb. No LEE. Reports full med compliance.    ECG: not performed   Labs (12/20): K 5.2 => 4.9, creatinine 1.26 => 1.32, BNP 628, hgb 14.4.  Serum immunofixation with IgA monoclonal protein (light chain).  Urine immunofixation negative.  Labs (1/21): K 5.2 => 4.5, creatinine 1.31 => 1.26 Labs (3/21): K 4.6,  creatinine 1.44 Labs (5/21): K 4.8, creatinine 1.38 Labs (7/21): K 5, creatinine 1.74 Labs (2/22): K 5.1, creatinine 1.56 Labs (5/22): K 4.3, creatinine 1.24 Labs (8/22): BNP 667, K 4.7, creatinine 1.42 Labs (12/22): K 5.1, creatinine 1.48 Labs (8/23): BNP 315, LDL 82, K 4.9, creatinine 1.77 Labs (12/23): Scr 1.71, K 4.6   PMH: 1. HTN 2. Hyperlipidemia 3. Prostate cancer 4. Osteoarthritis 5. Macular degeneration.  6. Chronic systolic CHF:  - Echo (Q000111Q): EF 55-60%, normal RV.  - Echo (11/20): Severe LVH with speckled pattern concerning for amyloidosis, EF 20% with diffuse hypokinesis, moderately decreased RV systolic function.  - LHC/RHC (12/20): No significant CAD.  Mean RA 2, PA 36/15, mean PCWP 14, CI 2.06.  - PYP scan: grade 3, H/CL ratio 2.8. Strongly suggestive of transthyretin amyloidosis.  Invitae gene testing negative for typical ATTR mutations, suggesting wild-type ATTR.  - Echo (5/22): EF 35-40%, severe LVH, mildly decreased RV systolic function, mild RVH, mild AI, mild MR.  - Echo (12/23): EF 35-40%, severe LVH, diffuse hypokinesis with apical sparing strain pattern consistent with amyloidosis, mildly decreased RV systolic function, mild MR, IVC normal.  7. Monoclonal gammopathy 8. LBBB  SH: Widower, retired, lives in [place].  Nonsmoker, occasional ETOH (not heavy).   FH: No history of premature CAD or cardiomyopathy.   ROS: All systems reviewed and negative except as per HPI.   Current Outpatient Medications  Medication Sig Dispense Refill   ENTRESTO 97-103 MG TAKE 1 TABLET BY MOUTH TWICE DAILY. 60 tablet  11   FARXIGA 10 MG TABS tablet TAKE 1 TABLET ONCE DAILY BEFORE BREAKFAST. 30 tablet 11   Glucosamine 500 MG CAPS Take 500 mg by mouth daily.      Melatonin 10 MG CAPS Take 10 mg by mouth daily.     Multiple Vitamin (MULTIVITAMIN WITH MINERALS) TABS tablet Take 1 tablet by mouth at bedtime.     simvastatin (ZOCOR) 20 MG tablet Take 20 mg by mouth at bedtime.      spironolactone (ALDACTONE) 25 MG tablet TAKE 1 TABLET ONCE DAILY. 90 tablet 3   Tafamidis (VYNDAMAX) 61 MG CAPS Take 1 capsule by mouth daily. 30 capsule 11   No current facility-administered medications for this encounter.   BP 138/76   Pulse 70   Wt 68.7 kg (151 lb 6.4 oz)   SpO2 97%   BMI 22.36 kg/m  PHYSICAL EXAM: General:  Well appearing, elderly WM No respiratory difficulty HEENT: normal Neck: supple. no JVD. Carotids 2+ bilat; no bruits. No lymphadenopathy or thyromegaly appreciated. Cor: PMI nondisplaced. Regular rate & rhythm. No rubs, gallops or murmurs. Lungs: clear Abdomen: soft, nontender, nondistended. No hepatosplenomegaly. No bruits or masses. Good bowel sounds. Extremities: no cyanosis, clubbing, rash, edema Neuro: alert & oriented x 3, cranial nerves grossly intact. moves all 4 extremities w/o difficulty. Affect pleasant.  Assessment/Plan: 1. Chronic systolic CHF: Nonischemic cardiomyopathy. Echo (11/20) with severe LVH and speckled pattern concerning for cardiac amyloidosis, EF 20%, moderately decreased RV systolic function.  LHC/RHC in 12/20 showed no significant CAD, filling pressures not elevated but cardiac output moderately depressed.  Appearance of myocardium is suggestive of amyloidosis, but this usually is not associated with a profound fall in EF. Corey does have symptoms that may be consistent with mild peripheral neuropathy.  PYP scan was strongly suggestive of ATTR cardiac amyloidosis.  Echo in 5/22 showed EF 35-40%, severe LVH, mildly decreased RV systolic function, mild RVH, mild AI, mild MR.  Echo 12/23 showed EF 35-40%, severe LVH, diffuse hypokinesis with apical sparing strain pattern consistent with amyloidosis, mildly decreased RV systolic function, mild MR, IVC normal.  - Euvolemic on exam. Stable NYHA class II-early III symptoms  - Continue Entresto 97/103 bid. - Continue spironolactone, 25 mg daily  - Continue Farxiga 10 mg daily.   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  Has some symptoms of peripheral neuropathy (tingling in his feet and some balance difficulty).  Myocardial appearance on echo is suggestive of amyloidosis.  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  However, serum immunofixation did show a monoclonal IgA light chain.  I suspect that this is ATTR amyloidosis given the strong positivity of the PYP scan.  Corey was seen by hematology (Dr. Irene Limbo) given the monoclonal light chain.  Corey does not want aggressive workup of the monoclonal gammopathy (no biopsies, would not want chemotherapy if multiple myeloma were found).  Invitae gene testing was negative for the common ATTR mutations, suggesting wild type ATTR amyloidosis.  Dr. Irene Limbo also thinks that Corey likely has ATTR amyloidosis.  - Continue tafamidis.   3. Depression: Corey has not wanted pharmacologic treatment.  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 Off ? blocker  Due to h/o bradycardia. Pulse rate 70 bpm today.   F/u w/ Dr. Aundra Dubin in 3-4 months   Lyda Jester, PA-C  12/03/2022

## 2022-12-03 ENCOUNTER — Encounter (HOSPITAL_COMMUNITY): Payer: Self-pay

## 2022-12-03 ENCOUNTER — Telehealth (HOSPITAL_COMMUNITY): Payer: Self-pay

## 2022-12-03 ENCOUNTER — Ambulatory Visit (HOSPITAL_COMMUNITY)
Admission: RE | Admit: 2022-12-03 | Discharge: 2022-12-03 | Disposition: A | Discharge status: Home / Self Care | Payer: Medicare Other | Source: Ambulatory Visit | Attending: Cardiology | Admitting: Cardiology

## 2022-12-03 VITALS — BP 138/76 | HR 70 | Wt 151.4 lb

## 2022-12-03 DIAGNOSIS — I440 Atrioventricular block, first degree: Secondary | ICD-10-CM | POA: Diagnosis not present

## 2022-12-03 DIAGNOSIS — F32A Depression, unspecified: Secondary | ICD-10-CM | POA: Diagnosis not present

## 2022-12-03 DIAGNOSIS — I428 Other cardiomyopathies: Secondary | ICD-10-CM | POA: Insufficient documentation

## 2022-12-03 DIAGNOSIS — E785 Hyperlipidemia, unspecified: Secondary | ICD-10-CM | POA: Insufficient documentation

## 2022-12-03 DIAGNOSIS — R202 Paresthesia of skin: Secondary | ICD-10-CM | POA: Diagnosis not present

## 2022-12-03 DIAGNOSIS — R0600 Dyspnea, unspecified: Secondary | ICD-10-CM | POA: Diagnosis not present

## 2022-12-03 DIAGNOSIS — I447 Left bundle-branch block, unspecified: Secondary | ICD-10-CM | POA: Diagnosis not present

## 2022-12-03 DIAGNOSIS — I110 Hypertensive heart disease with heart failure: Secondary | ICD-10-CM | POA: Diagnosis not present

## 2022-12-03 DIAGNOSIS — I5022 Chronic systolic (congestive) heart failure: Secondary | ICD-10-CM | POA: Insufficient documentation

## 2022-12-03 DIAGNOSIS — G629 Polyneuropathy, unspecified: Secondary | ICD-10-CM | POA: Diagnosis not present

## 2022-12-03 DIAGNOSIS — M1990 Unspecified osteoarthritis, unspecified site: Secondary | ICD-10-CM | POA: Diagnosis not present

## 2022-12-03 DIAGNOSIS — Z79899 Other long term (current) drug therapy: Secondary | ICD-10-CM | POA: Insufficient documentation

## 2022-12-03 DIAGNOSIS — Z8546 Personal history of malignant neoplasm of prostate: Secondary | ICD-10-CM | POA: Diagnosis not present

## 2022-12-03 DIAGNOSIS — Z7984 Long term (current) use of oral hypoglycemic drugs: Secondary | ICD-10-CM | POA: Insufficient documentation

## 2022-12-03 DIAGNOSIS — R5383 Other fatigue: Secondary | ICD-10-CM | POA: Insufficient documentation

## 2022-12-03 DIAGNOSIS — D472 Monoclonal gammopathy: Secondary | ICD-10-CM | POA: Diagnosis not present

## 2022-12-03 DIAGNOSIS — H3530 Unspecified macular degeneration: Secondary | ICD-10-CM | POA: Insufficient documentation

## 2022-12-03 LAB — BASIC METABOLIC PANEL
Anion gap: 9 (ref 5–15)
BUN: 55 mg/dL — ABNORMAL HIGH (ref 8–23)
CO2: 22 mmol/L (ref 22–32)
Calcium: 9.3 mg/dL (ref 8.9–10.3)
Chloride: 108 mmol/L (ref 98–111)
Creatinine, Ser: 1.65 mg/dL — ABNORMAL HIGH (ref 0.61–1.24)
GFR, Estimated: 39 mL/min — ABNORMAL LOW (ref >60)
Glucose, Bld: 93 mg/dL (ref 70–99)
Potassium: 5.4 mmol/L — ABNORMAL HIGH (ref 3.5–5.1)
Sodium: 139 mmol/L (ref 135–145)

## 2022-12-03 NOTE — Telephone Encounter (Signed)
-----   Message from Consuelo Pandy, [place] sent at 12/03/2022  4:42 PM EST ----- K elevated at 5.4. Stop Spironolactone. Hold Entresto for now and needs repeat BMP tomorrow 3/7, to ensure K is trending down.

## 2022-12-03 NOTE — Telephone Encounter (Signed)
Patient advised and verbalized understanding,lab appointment scheduled,lab orders entered. Med list updated to reflect changes.   Orders Placed This Encounter  Procedures   Basic metabolic panel    Standing Status:   Future    Standing Expiration Date:   12/03/2023    Order Specific Question:   Release to patient    Answer:   Immediate    Order Specific Question:   Release to patient    Answer:   Immediate [1]

## 2022-12-03 NOTE — Patient Instructions (Addendum)
Thank you for coming in today  Labs were done today, if any labs are abnormal the clinic will call you No news is good news  Medications: No changes   Follow up appointments:  Your physician recommends that you schedule a follow-up appointment in:  3-4 months with Dr. Kendall Flack will receive a reminder letter in the mail a few months in advance. If you don't receive a letter, please call our office to schedule the follow-up appointment.     Do the following things EVERYDAY: Weigh yourself in the morning before breakfast. Write it down and keep it in a log. Take your medicines as prescribed Eat low salt foods--Limit salt (sodium) to 2000 mg per day.  Stay as active as you can everyday Limit all fluids for the day to less than 2 liters   At the [facility], you and your health needs are our priority. As part of our continuing mission to provide you with exceptional heart care, we have created designated Provider Care Teams. These Care Teams include your primary Cardiologist (physician) and Advanced Practice Providers (APPs- Physician Assistants and Nurse Practitioners) who all work together to provide you with the care you need, when you need it.   You may see any of the following providers on your designated Care Team at your next follow up: Dr Glori Bickers Dr Loralie Champagne Dr. Roxana Hires, NP Lyda Jester, [place] [facility] [place], [place] Forestine Na, NP Audry Riles, PharmD   Please be sure to bring in all your medications bottles to every appointment.    Thank you for choosing [facility]  If you have any questions or concerns before your next appointment please send [place] a message through [place] or call our office at [phone].    TO LEAVE A MESSAGE FOR THE NURSE SELECT OPTION 2, PLEASE LEAVE A MESSAGE INCLUDING: YOUR NAME DATE OF BIRTH CALL BACK NUMBER REASON FOR CALL**this is  important as we prioritize the call backs  YOU WILL RECEIVE A CALL BACK THE SAME DAY AS LONG AS YOU CALL BEFORE 4:00 PM

## 2022-12-04 ENCOUNTER — Ambulatory Visit (HOSPITAL_COMMUNITY)
Admission: RE | Admit: 2022-12-04 | Discharge: 2022-12-04 | Disposition: A | Discharge status: Home / Self Care | Payer: Medicare Other | Source: Ambulatory Visit | Attending: Internal Medicine | Admitting: Internal Medicine

## 2022-12-04 ENCOUNTER — Telehealth (HOSPITAL_COMMUNITY): Payer: Self-pay | Admitting: Cardiology

## 2022-12-04 DIAGNOSIS — I5022 Chronic systolic (congestive) heart failure: Secondary | ICD-10-CM | POA: Insufficient documentation

## 2022-12-04 LAB — BASIC METABOLIC PANEL
Anion gap: 8 (ref 5–15)
BUN: 51 mg/dL — ABNORMAL HIGH (ref 8–23)
CO2: 21 mmol/L — ABNORMAL LOW (ref 22–32)
Calcium: 9.3 mg/dL (ref 8.9–10.3)
Chloride: 108 mmol/L (ref 98–111)
Creatinine, Ser: 1.76 mg/dL — ABNORMAL HIGH (ref 0.61–1.24)
GFR, Estimated: 36 mL/min — ABNORMAL LOW (ref >60)
Glucose, Bld: 88 mg/dL (ref 70–99)
Potassium: 5.3 mmol/L — ABNORMAL HIGH (ref 3.5–5.1)
Sodium: 137 mmol/L (ref 135–145)

## 2022-12-04 MED ORDER — FUROSEMIDE 20 MG PO TABS: 20.0000 mg | ORAL_TABLET | Freq: Every day | ORAL | 3 refills | Status: DC

## 2022-12-04 NOTE — Telephone Encounter (Signed)
-----   Message from Consuelo Pandy, [place] sent at 12/04/2022  4:42 PM EST ----- K remains mildly elevated. Stay off of spiro and Entresto. Start Lasix 20 mg daily. This will help drive down K and take place as diuretic w/ discontinuation of spiro and Entresto. Avoid K rich foods. Can we work in w/ pharmD or APP early next week to reassess BP/volume status w/ med changes and repeat BMP?

## 2022-12-04 NOTE — Telephone Encounter (Signed)
Pt aware of results and medication changes Unable to accommodate appt  Per VO Brittainy Simmons PA Monitor b/p (notify office for SBP greater than 130 consistently) Monitor weight, swelling, SOB Ok to f/u x 4 weeks   Pt aware f/u 12/29/22

## 2022-12-04 NOTE — Addendum Note (Signed)
Addended by: Tynleigh Birt, Sharlot Gowda on: 12/04/2022 04:48 PM   Modules accepted: Orders

## 2022-12-26 NOTE — Progress Notes (Signed)
Advanced Heart Failure Clinic Progress Note   PCP: Dr. Inda Merlin Cardiology: Dr. Aundra Dubin  89 y.o. returns for followup of CHF.  I took care of his wife in the past.  He had been generally health up until this fall.  He developed worsening fatigue as well as exertional dyspnea.  His PCP started him on Lasix 20 mg daily, which helped his breathing.  Echo was done (cannot review as done at non-[facility] facility) showing severe LVH with a speckled pattern concerning for amyloidosis.  Also noted was EF of 20%, diffuse hypokinesis, and moderate RV dysfunction.  LHC/RHC was done in 12/20, showing no significant CAD and optimized filling pressures with low cardiac output.    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    He saw Dr. Irene Limbo for evaluation of monoclonal gammopathy.  He would not want bone marrow biopsy or endomyocardial biopsy, and he would not want chemotherapy if multiple myeloma were found.  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   Echo in 5/22 showed EF 35-40%, severe LVH, mildly decreased RV systolic function, mild RVH, mild AI, mild MR.   Echo 12/23, EF 35-40%, severe LVH, diffuse hypokinesis with apical sparing strain pattern consistent with amyloidosis, mildly decreased RV systolic function, mild MR, IVC normal.   He presents today for 3 month f/u. Reports stable NYHA Class II-early III symptoms. Walks and works out w/ dumbbells for exercise. Notes occasional positional dizziness if he stands too quickly but no syncope/ near syncope. BP 138/76. Pulse rate 70 bpm. Wt stable since last OV, down 3 lb. No LEE. Reports full med compliance.   ECG: not performed   Labs (12/20): K 5.2 => 4.9, creatinine 1.26 => 1.32, BNP 628, hgb 14.4.  Serum immunofixation with IgA monoclonal protein (light chain).  Urine immunofixation negative.  Labs (1/21): K 5.2 => 4.5, creatinine 1.31 => 1.26 Labs (3/21): K 4.6,  creatinine 1.44 Labs (5/21): K 4.8, creatinine 1.38 Labs (7/21): K 5, creatinine 1.74 Labs (2/22): K 5.1, creatinine 1.56 Labs (5/22): K 4.3, creatinine 1.24 Labs (8/22): BNP 667, K 4.7, creatinine 1.42 Labs (12/22): K 5.1, creatinine 1.48 Labs (8/23): BNP 315, LDL 82, K 4.9, creatinine 1.77 Labs (12/23): Scr 1.71, K 4.6   PMH: 1. HTN 2. Hyperlipidemia 3. Prostate cancer 4. Osteoarthritis 5. Macular degeneration.  6. Chronic systolic CHF:  - Echo (Q000111Q): EF 55-60%, normal RV.  - Echo (11/20): Severe LVH with speckled pattern concerning for amyloidosis, EF 20% with diffuse hypokinesis, moderately decreased RV systolic function.  - LHC/RHC (12/20): No significant CAD.  Mean RA 2, PA 36/15, mean PCWP 14, CI 2.06.  - PYP scan: grade 3, H/CL ratio 2.8. Strongly suggestive of transthyretin amyloidosis.  Invitae gene testing negative for typical ATTR mutations, suggesting wild-type ATTR.  - Echo (5/22): EF 35-40%, severe LVH, mildly decreased RV systolic function, mild RVH, mild AI, mild MR.  - Echo (12/23): EF 35-40%, severe LVH, diffuse hypokinesis with apical sparing strain pattern consistent with amyloidosis, mildly decreased RV systolic function, mild MR, IVC normal.  7. Monoclonal gammopathy 8. LBBB  SH: Widower, retired, lives in [place].  Nonsmoker, occasional ETOH (not heavy).   FH: No history of premature CAD or cardiomyopathy.   ROS: All systems reviewed and negative except as per HPI.   Current Outpatient Medications  Medication Sig Dispense Refill   FARXIGA 10 MG TABS tablet TAKE 1 TABLET ONCE DAILY BEFORE BREAKFAST. [place]  tablet 11   furosemide (LASIX) 20 MG tablet Take 1 tablet (20 mg total) by mouth daily. 90 tablet 3   Glucosamine 500 MG CAPS Take 500 mg by mouth daily.      Melatonin 10 MG CAPS Take 10 mg by mouth daily.     Multiple Vitamin (MULTIVITAMIN WITH MINERALS) TABS tablet Take 1 tablet by mouth at bedtime.     simvastatin (ZOCOR) 20 MG tablet Take 20 mg by  mouth at bedtime.     Tafamidis (VYNDAMAX) 61 MG CAPS Take 1 capsule by mouth daily. 30 capsule 11   No current facility-administered medications for this visit.   There were no vitals taken for this visit. PHYSICAL EXAM: General:  Well appearing, elderly WM No respiratory difficulty HEENT: normal Neck: supple. no JVD. Carotids 2+ bilat; no bruits. No lymphadenopathy or thyromegaly appreciated. Cor: PMI nondisplaced. Regular rate & rhythm. No rubs, gallops or murmurs. Lungs: clear Abdomen: soft, nontender, nondistended. No hepatosplenomegaly. No bruits or masses. Good bowel sounds. Extremities: no cyanosis, clubbing, rash, edema Neuro: alert & oriented x 3, cranial nerves grossly intact. moves all 4 extremities w/o difficulty. Affect pleasant.  Assessment/Plan: 1. Chronic systolic CHF: Nonischemic cardiomyopathy. Echo (11/20) with severe LVH and speckled pattern concerning for cardiac amyloidosis, EF 20%, moderately decreased RV systolic function.  LHC/RHC in 12/20 showed no significant CAD, filling pressures not elevated but cardiac output moderately depressed.  Appearance of myocardium is suggestive of amyloidosis, but this usually is not associated with a profound fall in EF. He does have symptoms that may be consistent with mild peripheral neuropathy.  PYP scan was strongly suggestive of ATTR cardiac amyloidosis.  Echo in 5/22 showed EF 35-40%, severe LVH, mildly decreased RV systolic function, mild RVH, mild AI, mild MR.  Echo 12/23 showed EF 35-40%, severe LVH, diffuse hypokinesis with apical sparing strain pattern consistent with amyloidosis, mildly decreased RV systolic function, mild MR, IVC normal.  - Euvolemic on exam. Stable NYHA class II-early III symptoms  - Continue Entresto 97/103 bid. - Continue spironolactone, 25 mg daily  - Continue Farxiga 10 mg daily.   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  Has some symptoms of peripheral neuropathy (tingling in his feet and some balance difficulty).  Myocardial appearance on echo is suggestive of amyloidosis.  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  However, serum immunofixation did show a monoclonal IgA light chain.  I suspect that this is ATTR amyloidosis given the strong positivity of the PYP scan.  He was seen by hematology (Dr. Irene Limbo) given the monoclonal light chain.  He does not want aggressive workup of the monoclonal gammopathy (no biopsies, would not want chemotherapy if multiple myeloma were found).  Invitae gene testing was negative for the common ATTR mutations, suggesting wild type ATTR amyloidosis.  Dr. 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  - Continue tafamidis.   3. Depression: He has not wanted pharmacologic treatment.  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 Off ? blocker  Due to h/o bradycardia. Pulse rate 70 bpm today.   F/u w/ Dr. Aundra Dubin in 3-4 months   [place], [place]  12/26/2022

## 2022-12-29 ENCOUNTER — Encounter (HOSPITAL_COMMUNITY): Payer: Self-pay

## 2022-12-29 ENCOUNTER — Ambulatory Visit (HOSPITAL_COMMUNITY)
Admission: RE | Admit: 2022-12-29 | Discharge: 2022-12-29 | Disposition: A | Discharge status: Home / Self Care | Payer: Medicare Other | Source: Ambulatory Visit | Attending: Family Medicine | Admitting: Family Medicine

## 2022-12-29 VITALS — BP 138/80 | HR 67 | Wt 150.0 lb

## 2022-12-29 DIAGNOSIS — Z79899 Other long term (current) drug therapy: Secondary | ICD-10-CM | POA: Diagnosis not present

## 2022-12-29 DIAGNOSIS — R0602 Shortness of breath: Secondary | ICD-10-CM | POA: Diagnosis not present

## 2022-12-29 DIAGNOSIS — I440 Atrioventricular block, first degree: Secondary | ICD-10-CM | POA: Diagnosis not present

## 2022-12-29 DIAGNOSIS — E875 Hyperkalemia: Secondary | ICD-10-CM | POA: Diagnosis not present

## 2022-12-29 DIAGNOSIS — R202 Paresthesia of skin: Secondary | ICD-10-CM | POA: Diagnosis not present

## 2022-12-29 DIAGNOSIS — I428 Other cardiomyopathies: Secondary | ICD-10-CM | POA: Insufficient documentation

## 2022-12-29 DIAGNOSIS — I5022 Chronic systolic (congestive) heart failure: Secondary | ICD-10-CM | POA: Insufficient documentation

## 2022-12-29 DIAGNOSIS — I447 Left bundle-branch block, unspecified: Secondary | ICD-10-CM | POA: Diagnosis not present

## 2022-12-29 DIAGNOSIS — I110 Hypertensive heart disease with heart failure: Secondary | ICD-10-CM | POA: Insufficient documentation

## 2022-12-29 DIAGNOSIS — R42 Dizziness and giddiness: Secondary | ICD-10-CM | POA: Insufficient documentation

## 2022-12-29 DIAGNOSIS — F32A Depression, unspecified: Secondary | ICD-10-CM | POA: Diagnosis not present

## 2022-12-29 DIAGNOSIS — Z7984 Long term (current) use of oral hypoglycemic drugs: Secondary | ICD-10-CM | POA: Insufficient documentation

## 2022-12-29 DIAGNOSIS — E859 Amyloidosis, unspecified: Secondary | ICD-10-CM | POA: Diagnosis not present

## 2022-12-29 DIAGNOSIS — Z87898 Personal history of other specified conditions: Secondary | ICD-10-CM

## 2022-12-29 LAB — BASIC METABOLIC PANEL
Anion gap: 11 (ref 5–15)
BUN: 53 mg/dL — ABNORMAL HIGH (ref 8–23)
CO2: 26 mmol/L (ref 22–32)
Calcium: 9.3 mg/dL (ref 8.9–10.3)
Chloride: 104 mmol/L (ref 98–111)
Creatinine, Ser: 1.94 mg/dL — ABNORMAL HIGH (ref 0.61–1.24)
GFR, Estimated: 32 mL/min — ABNORMAL LOW (ref >60)
Glucose, Bld: 100 mg/dL — ABNORMAL HIGH (ref 70–99)
Potassium: 4.6 mmol/L (ref 3.5–5.1)
Sodium: 141 mmol/L (ref 135–145)

## 2022-12-29 LAB — BRAIN NATRIURETIC PEPTIDE: B Natriuretic Peptide: 388 pg/mL — ABNORMAL HIGH (ref 0.0–100.0)

## 2022-12-29 NOTE — Patient Instructions (Addendum)
Thank you for coming in today  Labs were done today, if any labs are abnormal the clinic will call you No news is good news  Medications: No changes   Follow up appointments:  Your physician recommends that you schedule a follow-up appointment in:  You will receive a reminder letter in the mail a few months in advance. If you don't receive a letter, please call our office to schedule the follow-up appointment.     Do the following things EVERYDAY: Weigh yourself in the morning before breakfast. Write it down and keep it in a log. Take your medicines as prescribed Eat low salt foods--Limit salt (sodium) to 2000 mg per day.  Stay as active as you can everyday Limit all fluids for the day to less than 2 liters   At the [facility], you and your health needs are our priority. As part of our continuing mission to provide you with exceptional heart care, we have created designated Provider Care Teams. These Care Teams include your primary Cardiologist (physician) and Advanced Practice Providers (APPs- Physician Assistants and Nurse Practitioners) who all work together to provide you with the care you need, when you need it.   You may see any of the following providers on your designated Care Team at your next follow up: Dr Glori Bickers Dr Loralie Champagne Dr. Roxana Hires, NP Lyda Jester, [place] [facility] [place], [place] Forestine Na, NP Audry Riles, PharmD   Please be sure to bring in all your medications bottles to every appointment.    Thank you for choosing [facility]  If you have any questions or concerns before your next appointment please send [place] a message through [place] or call our office at [phone].    TO LEAVE A MESSAGE FOR THE NURSE SELECT OPTION 2, PLEASE LEAVE A MESSAGE INCLUDING: YOUR NAME DATE OF BIRTH CALL BACK NUMBER REASON FOR CALL**this is important as we prioritize  the call backs  YOU WILL RECEIVE A CALL BACK THE SAME DAY AS LONG AS YOU CALL BEFORE 4:00 PM

## 2022-12-31 ENCOUNTER — Other Ambulatory Visit (HOSPITAL_COMMUNITY): Payer: Self-pay | Admitting: Cardiology

## 2023-01-06 DIAGNOSIS — L570 Actinic keratosis: Secondary | ICD-10-CM | POA: Diagnosis not present

## 2023-01-06 DIAGNOSIS — Z85828 Personal history of other malignant neoplasm of skin: Secondary | ICD-10-CM | POA: Diagnosis not present

## 2023-01-06 DIAGNOSIS — D1801 Hemangioma of skin and subcutaneous tissue: Secondary | ICD-10-CM | POA: Diagnosis not present

## 2023-01-06 DIAGNOSIS — L821 Other seborrheic keratosis: Secondary | ICD-10-CM | POA: Diagnosis not present

## 2023-01-06 DIAGNOSIS — L812 Freckles: Secondary | ICD-10-CM | POA: Diagnosis not present

## 2023-01-22 DIAGNOSIS — Z23 Encounter for immunization: Secondary | ICD-10-CM | POA: Diagnosis not present

## 2023-03-09 ENCOUNTER — Telehealth (HOSPITAL_COMMUNITY): Payer: Self-pay | Admitting: Pharmacy Technician

## 2023-03-09 ENCOUNTER — Other Ambulatory Visit (HOSPITAL_COMMUNITY): Payer: Self-pay

## 2023-03-09 NOTE — Telephone Encounter (Signed)
Advanced Heart Failure Patient Advocate Encounter  Received PA request from patient's commercial insurance [facility] (pharmacy [facility]) for Vyndamax. The patient is currently using [employer] to fill this medication (approved through 09/29/23). Called insurance and closed out request.   Archer Asa, CPhT

## 2023-03-22 ENCOUNTER — Other Ambulatory Visit (HOSPITAL_COMMUNITY): Payer: Self-pay | Admitting: Cardiology

## 2023-03-31 DIAGNOSIS — H52203 Unspecified astigmatism, bilateral: Secondary | ICD-10-CM | POA: Diagnosis not present

## 2023-03-31 DIAGNOSIS — Z961 Presence of intraocular lens: Secondary | ICD-10-CM | POA: Diagnosis not present

## 2023-04-13 DIAGNOSIS — Z8546 Personal history of malignant neoplasm of prostate: Secondary | ICD-10-CM | POA: Diagnosis not present

## 2023-04-13 DIAGNOSIS — Z131 Encounter for screening for diabetes mellitus: Secondary | ICD-10-CM | POA: Diagnosis not present

## 2023-04-13 DIAGNOSIS — E859 Amyloidosis, unspecified: Secondary | ICD-10-CM | POA: Diagnosis not present

## 2023-04-13 DIAGNOSIS — F411 Generalized anxiety disorder: Secondary | ICD-10-CM | POA: Diagnosis not present

## 2023-04-13 DIAGNOSIS — I10 Essential (primary) hypertension: Secondary | ICD-10-CM | POA: Diagnosis not present

## 2023-04-13 DIAGNOSIS — Z87891 Personal history of nicotine dependence: Secondary | ICD-10-CM | POA: Diagnosis not present

## 2023-04-13 DIAGNOSIS — I509 Heart failure, unspecified: Secondary | ICD-10-CM | POA: Diagnosis not present

## 2023-04-13 DIAGNOSIS — Z1331 Encounter for screening for depression: Secondary | ICD-10-CM | POA: Diagnosis not present

## 2023-04-13 DIAGNOSIS — Z0000 Encounter for general adult medical examination without abnormal findings: Secondary | ICD-10-CM | POA: Diagnosis not present

## 2023-04-13 DIAGNOSIS — D81818 Other biotin-dependent carboxylase deficiency: Secondary | ICD-10-CM | POA: Diagnosis not present

## 2023-04-13 DIAGNOSIS — H3530 Unspecified macular degeneration: Secondary | ICD-10-CM | POA: Diagnosis not present

## 2023-04-13 DIAGNOSIS — F4321 Adjustment disorder with depressed mood: Secondary | ICD-10-CM | POA: Diagnosis not present

## 2023-04-13 DIAGNOSIS — E782 Mixed hyperlipidemia: Secondary | ICD-10-CM | POA: Diagnosis not present

## 2023-07-09 DIAGNOSIS — Z85828 Personal history of other malignant neoplasm of skin: Secondary | ICD-10-CM | POA: Diagnosis not present

## 2023-07-09 DIAGNOSIS — L821 Other seborrheic keratosis: Secondary | ICD-10-CM | POA: Diagnosis not present

## 2023-07-09 DIAGNOSIS — D692 Other nonthrombocytopenic purpura: Secondary | ICD-10-CM | POA: Diagnosis not present

## 2023-07-09 DIAGNOSIS — L570 Actinic keratosis: Secondary | ICD-10-CM | POA: Diagnosis not present

## 2023-07-09 DIAGNOSIS — D1801 Hemangioma of skin and subcutaneous tissue: Secondary | ICD-10-CM | POA: Diagnosis not present

## 2023-09-24 ENCOUNTER — Other Ambulatory Visit (HOSPITAL_COMMUNITY): Payer: Self-pay

## 2023-10-12 ENCOUNTER — Telehealth (HOSPITAL_COMMUNITY): Payer: Self-pay | Admitting: Pharmacy Technician

## 2023-10-12 NOTE — Telephone Encounter (Addendum)
 Patient Advocate Encounter   Received notification from [facility] that prior authorization for Vyndamax is required.   PA submitted on CoverMyMeds Key [id number] Status is pending   Will continue to follow.

## 2023-10-13 NOTE — Telephone Encounter (Signed)
 Advanced Heart Failure Patient Advocate Encounter  Prior Authorization for Corey Harrison has been approved.    PA# [id number] Effective dates: 09/30/23 through 09/28/24  Archer Asa, CPhT

## 2023-10-16 DIAGNOSIS — Z85828 Personal history of other malignant neoplasm of skin: Secondary | ICD-10-CM | POA: Diagnosis not present

## 2023-10-16 DIAGNOSIS — C44329 Squamous cell carcinoma of skin of other parts of face: Secondary | ICD-10-CM | POA: Diagnosis not present

## 2023-10-22 ENCOUNTER — Telehealth (HOSPITAL_COMMUNITY): Payer: Self-pay | Admitting: Pharmacy Technician

## 2023-10-22 NOTE — Telephone Encounter (Signed)
Advanced Heart Failure Patient Advocate Encounter  Patient called in requesting help with Vyndamax cost. [employer] insurance has $2000 co-pay. I explained to the patient that using his medicare insurance would result in this high co-pay (Patient used [facility] commercial with co-pay card in the past, switched to [facility] last year). Patient is over the income limit for any kind of financial assistance. I explained that after he pays this co-pay, it should fulfill his medication cost for the year. The rest of the medications should be $0 through the remainder of the year.   Advised patient to call back with issues.   Archer Asa, CPhT

## 2023-11-20 ENCOUNTER — Other Ambulatory Visit (HOSPITAL_COMMUNITY): Payer: Self-pay | Admitting: Cardiology

## 2023-11-20 NOTE — Telephone Encounter (Signed)
 This is a CHF pt

## 2023-11-25 ENCOUNTER — Ambulatory Visit (HOSPITAL_COMMUNITY)
Admission: RE | Admit: 2023-11-25 | Discharge: 2023-11-25 | Discharge status: Home / Self Care | Payer: Medicare Other | Source: Ambulatory Visit | Attending: Physician Assistant

## 2023-11-25 ENCOUNTER — Encounter (HOSPITAL_COMMUNITY): Payer: Self-pay

## 2023-11-25 VITALS — BP 130/88 | HR 84 | Ht 70.0 in | Wt 154.2 lb

## 2023-11-25 DIAGNOSIS — Z79899 Other long term (current) drug therapy: Secondary | ICD-10-CM | POA: Insufficient documentation

## 2023-11-25 DIAGNOSIS — E875 Hyperkalemia: Secondary | ICD-10-CM | POA: Insufficient documentation

## 2023-11-25 DIAGNOSIS — I43 Cardiomyopathy in diseases classified elsewhere: Secondary | ICD-10-CM | POA: Insufficient documentation

## 2023-11-25 DIAGNOSIS — I440 Atrioventricular block, first degree: Secondary | ICD-10-CM | POA: Insufficient documentation

## 2023-11-25 DIAGNOSIS — I447 Left bundle-branch block, unspecified: Secondary | ICD-10-CM | POA: Insufficient documentation

## 2023-11-25 DIAGNOSIS — I5022 Chronic systolic (congestive) heart failure: Secondary | ICD-10-CM | POA: Insufficient documentation

## 2023-11-25 DIAGNOSIS — I428 Other cardiomyopathies: Secondary | ICD-10-CM | POA: Insufficient documentation

## 2023-11-25 DIAGNOSIS — Z8042 Family history of malignant neoplasm of prostate: Secondary | ICD-10-CM | POA: Insufficient documentation

## 2023-11-25 DIAGNOSIS — E854 Organ-limited amyloidosis: Secondary | ICD-10-CM | POA: Diagnosis not present

## 2023-11-25 DIAGNOSIS — R001 Bradycardia, unspecified: Secondary | ICD-10-CM | POA: Diagnosis not present

## 2023-11-25 LAB — BASIC METABOLIC PANEL
Anion gap: 9 (ref 5–15)
BUN: 44 mg/dL — ABNORMAL HIGH (ref 8–23)
CO2: 26 mmol/L (ref 22–32)
Calcium: 9.2 mg/dL (ref 8.9–10.3)
Chloride: 105 mmol/L (ref 98–111)
Creatinine, Ser: 1.84 mg/dL — ABNORMAL HIGH (ref 0.61–1.24)
GFR, Estimated: 34 mL/min — ABNORMAL LOW (ref >60)
Glucose, Bld: 101 mg/dL — ABNORMAL HIGH (ref 70–99)
Potassium: 4.8 mmol/L (ref 3.5–5.1)
Sodium: 140 mmol/L (ref 135–145)

## 2023-11-25 LAB — BRAIN NATRIURETIC PEPTIDE: B Natriuretic Peptide: 252.3 pg/mL — ABNORMAL HIGH (ref 0.0–100.0)

## 2023-11-25 NOTE — Patient Instructions (Signed)
 Medication Changes:  No Changes In Medications at this time.   Lab Work:  Labs done today, your results will be available in MyChart, we will contact you for abnormal readings.  Follow-Up in: IN 6 MONTHS WITH DR. Shirlee Latch PLEASE CALL OUR OFFICE AROUND JUNE TO GET SCHEDULED FOR YOUR APPOINTMENT. PHONE NUMBER IS [phone] OPTION 2   At the Advanced Heart Failure Clinic, you and your health needs are our priority. We have a designated team specialized in the treatment of Heart Failure. This Care Team includes your primary Heart Failure Specialized Cardiologist (physician), Advanced Practice Providers (APPs- Physician Assistants and Nurse Practitioners), and Pharmacist who all work together to provide you with the care you need, when you need it.   You may see any of the following providers on your designated Care Team at your next follow up:  Dr. Arvilla Meres Dr. Marca Ancona Dr. Dorthula Nettles Dr. Theresia Bough Tonye Becket, NP Robbie Lis, [place] [facility], [place] Brynda Peon, NP [place] Lee, NP Karle Plumber, PharmD   Please be sure to bring in all your medications bottles to every appointment.   Need to Contact us:  If you have any questions or concerns before your next appointment please send [place] a message through [place] or call our office at [phone].    TO LEAVE A MESSAGE FOR THE NURSE SELECT OPTION 2, PLEASE LEAVE A MESSAGE INCLUDING: YOUR NAME DATE OF BIRTH CALL BACK NUMBER REASON FOR CALL**this is important as we prioritize the call backs  YOU WILL RECEIVE A CALL BACK THE SAME DAY AS LONG AS YOU CALL BEFORE 4:00 PM

## 2023-11-25 NOTE — Progress Notes (Addendum)
 Advanced Heart Failure Clinic Note   PCP: Dr Pearline Cables [facility]) HF Cardiology: Dr. Shirlee Latch  89 y.o. with history of chronic systolic CHF, cardiac amyloidosis, LBBB, HTN, HLD, prostate cancer, macular degeneration, depression.  Corey Harrison saw Dr. Shirlee Latch in 12/20 for new HF evaluation. Echo (cannot review as done at non-[facility] facility) prior to visit with severe LVH with a speckled pattern concerning for amyloidosis.  Also noted was EF of 20%, diffuse hypokinesis, and moderate RV dysfunction.  LHC/RHC 12/20, showing no significant CAD and optimized filling pressures with low cardiac output.    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    Corey Harrison saw Dr. Candise Che for evaluation of monoclonal gammopathy.  Corey Harrison would not want bone marrow biopsy or endomyocardial biopsy, and Corey Harrison would not want chemotherapy if multiple myeloma were found.  Dr. Candise Che agreed that Corey Harrison likely has ATTR amyloidosis.   Echo in 5/22 showed EF 35-40%, severe LVH, mildly decreased RV systolic function, mild RVH, mild AI, mild MR.   Echo 12/23, EF 35-40%, severe LVH, diffuse hypokinesis with apical sparing strain pattern consistent with amyloidosis, mildly decreased RV systolic function, mild MR, IVC normal.   Corey Harrison was last seen for follow-up in 04/24. Corey Harrison was stable from HF standpoint. No changes to regimen.  Corey Harrison is here today for following regarding CHF and cardiac amyloidosis. Overall Corey Harrison has been stable. Notes some gradual functional decline over the last 3-5 years. Corey Harrison reports fatigue, dyspnea with exertion and balance issues which are chronic. No lower extremity edema. Weight stable between 148-150 lb. Corey Harrison ambulates for a total of 25 minutes, in increments, each day. Previously enjoyed running and weight lifting, Corey Harrison is frustrated Corey Harrison can't do these activities any longer. Takes all medications as prescribed.    Widower. Patient lives independently in a retirement  community.   FH: No history of premature CAD or cardiomyopathy.   ROS: All systems reviewed and negative except as per HPI.   Current Outpatient Medications  Medication Sig Dispense Refill   cetirizine (ZYRTEC) 10 MG tablet Take 10 mg by mouth daily.     FARXIGA 10 MG TABS tablet TAKE 1 TABLET ONCE DAILY BEFORE BREAKFAST. 30 tablet 11   furosemide (LASIX) 20 MG tablet Take 1 tablet (20 mg total) by mouth daily. 90 tablet 3   Melatonin 10 MG CAPS Take 10 mg by mouth daily.     Multiple Vitamin (MULTIVITAMIN WITH MINERALS) TABS tablet Take 1 tablet by mouth at bedtime.     Multiple Vitamins-Minerals (PRESERVISION AREDS) CAPS Take by mouth.     simvastatin (ZOCOR) 20 MG tablet Take 20 mg by mouth at bedtime.     VYNDAMAX 61 MG CAPS TAKE 1 CAPSULE BY MOUTH 1 TIME A DAY. 30 capsule 11   Glucosamine 500 MG CAPS Take 500 mg by mouth daily.  (Patient not taking: Reported on 11/25/2023)     No current facility-administered medications for this encounter.   Wt Readings from Last 3 Encounters:  11/25/23 69.9 kg (154 lb 3.2 oz)  12/29/22 68 kg (150 lb)  12/03/22 68.7 kg (151 lb 6.4 oz)   BP 130/88 (BP Location: Left Arm, Patient Position: Sitting, Cuff Size: Normal)   Pulse 84   Ht 5\' 10"  (1.778 m)   Wt 69.9 kg (154 lb 3.2 oz)   SpO2 94%   BMI 22.13 kg/m   PHYSICAL EXAM: General:  Appears younger than stated age. Neck: no JVD.  Cor:  Regular rate & rhythm. No rubs, gallops or murmurs. Lungs: clear Abdomen: soft, nontender, nondistended.  Extremities: no edema Neuro: alert & orientedx3. Affect pleasant   Assessment/Plan: 1. Chronic systolic CHF:  - Nonischemic cardiomyopathy.  - Echo (11/20) with severe LVH and speckled pattern concerning for cardiac amyloidosis, EF 20%, moderately decreased RV systolic function. - LHC/RHC12/20: showed no significant CAD, filling pressures not elevated but cardiac output moderately depressed.   - Appearance of myocardium is suggestive of amyloidosis,  but this usually is not associated with a profound fall in EF. Corey Harrison does have symptoms that may be consistent with mild peripheral neuropathy.  PYP scan was strongly suggestive of ATTR cardiac amyloidosis.   - Echo in 5/22 showed EF 35-40%, severe LVH, mildly decreased RV systolic function.   - Echo 12/23 showed EF 35-40%, severe LVH, diffuse hypokinesis with apical sparing strain pattern consistent with amyloidosis, mildly decreased RV systolic function - NYHA II/early III. Corey Harrison remains quite functional for his age/comorbidities. Volume looks good today. Continue lasix 20 mg daily. BMET and BNP today - GDMT limited by hyperkalemia. - Continue Farxiga 10 mg daily.  - Off Entresto and spiro with hyperkalemia  - Off Coreg due to h/o bradycardia and fatigue - Check BMET/BNP today - Corey Harrison is not interested in repeat echo or any other testing  2. Cardiac amyloidosis:  - Strongly suspected.  Has some symptoms of peripheral neuropathy (tingling in his feet and some balance difficulty).  Myocardial appearance on echo is suggestive of amyloidosis.  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  However, serum immunofixation did show a monoclonal IgA light chain.  Suspect that this is ATTR amyloidosis given the strong positivity of the PYP scan.  Corey Harrison was seen by hematology (Dr. Candise Che) given the monoclonal light chain.  Corey Harrison does not want aggressive workup of the monoclonal gammopathy (no biopsies, would not want chemotherapy if multiple myeloma were found).  Invitae gene testing was negative for the common ATTR mutations, suggesting wild type ATTR amyloidosis.  Dr. Candise Che also thought that Corey Harrison likely has ATTR amyloidosis.  - Continue tafamidis.    3. 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  - Remain off beta blocker d/t history of bradycardia   Follow up 6 months with Dr. Shirlee Latch  [facility], Dalbert Garnet, PA-C 11/25/2023

## 2023-12-30 ENCOUNTER — Other Ambulatory Visit (HOSPITAL_COMMUNITY): Payer: Self-pay | Admitting: Internal Medicine

## 2024-01-21 ENCOUNTER — Telehealth (HOSPITAL_COMMUNITY): Payer: Self-pay

## 2024-01-21 ENCOUNTER — Other Ambulatory Visit (HOSPITAL_COMMUNITY): Payer: Self-pay

## 2024-01-21 NOTE — Telephone Encounter (Signed)
 Advanced Heart Failure Patient Advocate Encounter  Patient left voicemail concerning Vyndamax . [facility] advised patient to have office contact [facility] for billing issues. Review of coverage shows that [facility] may be processing RX [facility] instead of [employer].  Contacted [facility] by phone, rx was reprocessed to [facility] and representative confirmed $0 copay. Patient should have Vyndamax  delivered on 01/29/2024. Left voicemail for patient with update.  Kennis Peacock, CPhT Rx Patient Advocate Phone: [phone]

## 2024-03-16 DIAGNOSIS — M531 Cervicobrachial syndrome: Secondary | ICD-10-CM | POA: Diagnosis not present

## 2024-03-16 DIAGNOSIS — M9901 Segmental and somatic dysfunction of cervical region: Secondary | ICD-10-CM | POA: Diagnosis not present

## 2024-03-16 DIAGNOSIS — M9902 Segmental and somatic dysfunction of thoracic region: Secondary | ICD-10-CM | POA: Diagnosis not present

## 2024-03-18 DIAGNOSIS — M9901 Segmental and somatic dysfunction of cervical region: Secondary | ICD-10-CM | POA: Diagnosis not present

## 2024-03-18 DIAGNOSIS — M9902 Segmental and somatic dysfunction of thoracic region: Secondary | ICD-10-CM | POA: Diagnosis not present

## 2024-03-18 DIAGNOSIS — M531 Cervicobrachial syndrome: Secondary | ICD-10-CM | POA: Diagnosis not present

## 2024-03-24 DIAGNOSIS — M9901 Segmental and somatic dysfunction of cervical region: Secondary | ICD-10-CM | POA: Diagnosis not present

## 2024-03-24 DIAGNOSIS — M9902 Segmental and somatic dysfunction of thoracic region: Secondary | ICD-10-CM | POA: Diagnosis not present

## 2024-03-24 DIAGNOSIS — M531 Cervicobrachial syndrome: Secondary | ICD-10-CM | POA: Diagnosis not present

## 2024-03-27 ENCOUNTER — Other Ambulatory Visit (HOSPITAL_COMMUNITY): Payer: Self-pay | Admitting: Cardiology

## 2024-03-30 DIAGNOSIS — M531 Cervicobrachial syndrome: Secondary | ICD-10-CM | POA: Diagnosis not present

## 2024-03-30 DIAGNOSIS — M9902 Segmental and somatic dysfunction of thoracic region: Secondary | ICD-10-CM | POA: Diagnosis not present

## 2024-03-30 DIAGNOSIS — M9901 Segmental and somatic dysfunction of cervical region: Secondary | ICD-10-CM | POA: Diagnosis not present

## 2024-04-05 DIAGNOSIS — Z961 Presence of intraocular lens: Secondary | ICD-10-CM | POA: Diagnosis not present

## 2024-04-05 DIAGNOSIS — H52203 Unspecified astigmatism, bilateral: Secondary | ICD-10-CM | POA: Diagnosis not present

## 2024-04-06 DIAGNOSIS — M9902 Segmental and somatic dysfunction of thoracic region: Secondary | ICD-10-CM | POA: Diagnosis not present

## 2024-04-06 DIAGNOSIS — M9901 Segmental and somatic dysfunction of cervical region: Secondary | ICD-10-CM | POA: Diagnosis not present

## 2024-04-06 DIAGNOSIS — M531 Cervicobrachial syndrome: Secondary | ICD-10-CM | POA: Diagnosis not present

## 2024-04-08 DIAGNOSIS — M9901 Segmental and somatic dysfunction of cervical region: Secondary | ICD-10-CM | POA: Diagnosis not present

## 2024-04-08 DIAGNOSIS — M9902 Segmental and somatic dysfunction of thoracic region: Secondary | ICD-10-CM | POA: Diagnosis not present

## 2024-04-08 DIAGNOSIS — M531 Cervicobrachial syndrome: Secondary | ICD-10-CM | POA: Diagnosis not present

## 2024-04-14 DIAGNOSIS — Z1331 Encounter for screening for depression: Secondary | ICD-10-CM | POA: Diagnosis not present

## 2024-04-14 DIAGNOSIS — I10 Essential (primary) hypertension: Secondary | ICD-10-CM | POA: Diagnosis not present

## 2024-04-14 DIAGNOSIS — Z23 Encounter for immunization: Secondary | ICD-10-CM | POA: Diagnosis not present

## 2024-04-14 DIAGNOSIS — E782 Mixed hyperlipidemia: Secondary | ICD-10-CM | POA: Diagnosis not present

## 2024-04-14 DIAGNOSIS — D81818 Other biotin-dependent carboxylase deficiency: Secondary | ICD-10-CM | POA: Diagnosis not present

## 2024-04-14 DIAGNOSIS — F4321 Adjustment disorder with depressed mood: Secondary | ICD-10-CM | POA: Diagnosis not present

## 2024-04-14 DIAGNOSIS — M1990 Unspecified osteoarthritis, unspecified site: Secondary | ICD-10-CM | POA: Diagnosis not present

## 2024-04-14 DIAGNOSIS — F411 Generalized anxiety disorder: Secondary | ICD-10-CM | POA: Diagnosis not present

## 2024-04-14 DIAGNOSIS — Z0000 Encounter for general adult medical examination without abnormal findings: Secondary | ICD-10-CM | POA: Diagnosis not present

## 2024-04-14 DIAGNOSIS — Z8546 Personal history of malignant neoplasm of prostate: Secondary | ICD-10-CM | POA: Diagnosis not present

## 2024-04-14 DIAGNOSIS — I509 Heart failure, unspecified: Secondary | ICD-10-CM | POA: Diagnosis not present

## 2024-04-14 DIAGNOSIS — H3530 Unspecified macular degeneration: Secondary | ICD-10-CM | POA: Diagnosis not present

## 2024-04-14 DIAGNOSIS — Z87891 Personal history of nicotine dependence: Secondary | ICD-10-CM | POA: Diagnosis not present

## 2024-04-14 DIAGNOSIS — E859 Amyloidosis, unspecified: Secondary | ICD-10-CM | POA: Diagnosis not present

## 2024-04-22 DIAGNOSIS — M9901 Segmental and somatic dysfunction of cervical region: Secondary | ICD-10-CM | POA: Diagnosis not present

## 2024-04-22 DIAGNOSIS — M531 Cervicobrachial syndrome: Secondary | ICD-10-CM | POA: Diagnosis not present

## 2024-04-22 DIAGNOSIS — M9902 Segmental and somatic dysfunction of thoracic region: Secondary | ICD-10-CM | POA: Diagnosis not present

## 2024-04-29 DIAGNOSIS — M9902 Segmental and somatic dysfunction of thoracic region: Secondary | ICD-10-CM | POA: Diagnosis not present

## 2024-04-29 DIAGNOSIS — M9901 Segmental and somatic dysfunction of cervical region: Secondary | ICD-10-CM | POA: Diagnosis not present

## 2024-04-29 DIAGNOSIS — M531 Cervicobrachial syndrome: Secondary | ICD-10-CM | POA: Diagnosis not present

## 2024-05-06 DIAGNOSIS — M531 Cervicobrachial syndrome: Secondary | ICD-10-CM | POA: Diagnosis not present

## 2024-05-06 DIAGNOSIS — M9902 Segmental and somatic dysfunction of thoracic region: Secondary | ICD-10-CM | POA: Diagnosis not present

## 2024-05-06 DIAGNOSIS — M9901 Segmental and somatic dysfunction of cervical region: Secondary | ICD-10-CM | POA: Diagnosis not present

## 2024-05-09 ENCOUNTER — Encounter (HOSPITAL_COMMUNITY): Payer: Self-pay | Admitting: Cardiology

## 2024-05-09 ENCOUNTER — Ambulatory Visit (HOSPITAL_COMMUNITY): Payer: Self-pay | Admitting: Cardiology

## 2024-05-09 ENCOUNTER — Telehealth (HOSPITAL_COMMUNITY): Payer: Self-pay | Admitting: Pharmacy Technician

## 2024-05-09 ENCOUNTER — Telehealth (HOSPITAL_COMMUNITY): Payer: Self-pay | Admitting: Pharmacist

## 2024-05-09 ENCOUNTER — Other Ambulatory Visit (HOSPITAL_COMMUNITY): Payer: Self-pay

## 2024-05-09 ENCOUNTER — Ambulatory Visit (HOSPITAL_COMMUNITY)
Admission: RE | Admit: 2024-05-09 | Discharge: 2024-05-09 | Disposition: A | Discharge status: Home / Self Care | Source: Ambulatory Visit | Attending: Cardiology | Admitting: Cardiology

## 2024-05-09 VITALS — BP 120/70 | HR 66 | Wt 148.0 lb

## 2024-05-09 DIAGNOSIS — I447 Left bundle-branch block, unspecified: Secondary | ICD-10-CM | POA: Diagnosis not present

## 2024-05-09 DIAGNOSIS — F32A Depression, unspecified: Secondary | ICD-10-CM | POA: Insufficient documentation

## 2024-05-09 DIAGNOSIS — I440 Atrioventricular block, first degree: Secondary | ICD-10-CM | POA: Insufficient documentation

## 2024-05-09 DIAGNOSIS — I509 Heart failure, unspecified: Secondary | ICD-10-CM | POA: Diagnosis present

## 2024-05-09 DIAGNOSIS — Z79899 Other long term (current) drug therapy: Secondary | ICD-10-CM | POA: Insufficient documentation

## 2024-05-09 DIAGNOSIS — I5022 Chronic systolic (congestive) heart failure: Secondary | ICD-10-CM | POA: Diagnosis not present

## 2024-05-09 DIAGNOSIS — E8582 Wild-type transthyretin-related (ATTR) amyloidosis: Secondary | ICD-10-CM | POA: Diagnosis not present

## 2024-05-09 DIAGNOSIS — R2689 Other abnormalities of gait and mobility: Secondary | ICD-10-CM | POA: Diagnosis not present

## 2024-05-09 DIAGNOSIS — I428 Other cardiomyopathies: Secondary | ICD-10-CM | POA: Insufficient documentation

## 2024-05-09 DIAGNOSIS — E782 Mixed hyperlipidemia: Secondary | ICD-10-CM | POA: Diagnosis not present

## 2024-05-09 DIAGNOSIS — Z7984 Long term (current) use of oral hypoglycemic drugs: Secondary | ICD-10-CM | POA: Diagnosis not present

## 2024-05-09 LAB — BASIC METABOLIC PANEL WITH GFR
Anion gap: 13 (ref 5–15)
BUN: 48 mg/dL — ABNORMAL HIGH (ref 8–23)
CO2: 26 mmol/L (ref 22–32)
Calcium: 9.7 mg/dL (ref 8.9–10.3)
Chloride: 103 mmol/L (ref 98–111)
Creatinine, Ser: 1.79 mg/dL — ABNORMAL HIGH (ref 0.61–1.24)
GFR, Estimated: 35 mL/min — ABNORMAL LOW (ref >60)
Glucose, Bld: 101 mg/dL — ABNORMAL HIGH (ref 70–99)
Potassium: 4.6 mmol/L (ref 3.5–5.1)
Sodium: 142 mmol/L (ref 135–145)

## 2024-05-09 LAB — LIPID PANEL
Cholesterol: 159 mg/dL (ref 0–200)
HDL: 74 mg/dL (ref >40)
LDL Cholesterol: 77 mg/dL (ref 0–99)
Total CHOL/HDL Ratio: 2.1 ratio
Triglycerides: 40 mg/dL (ref <150)
VLDL: 8 mg/dL (ref 0–40)

## 2024-05-09 LAB — BRAIN NATRIURETIC PEPTIDE: B Natriuretic Peptide: 350 pg/mL — ABNORMAL HIGH (ref 0.0–100.0)

## 2024-05-09 MED ORDER — ENTRESTO 24-26 MG PO TABS: 1.0000 | ORAL_TABLET | Freq: Two times a day (BID) | ORAL | 6 refills | Status: AC

## 2024-05-09 MED ORDER — SACUBITRIL-VALSARTAN 24-26 MG PO TABS: 1.0000 | ORAL_TABLET | Freq: Two times a day (BID) | ORAL | 6 refills | Status: DC

## 2024-05-09 NOTE — Telephone Encounter (Signed)
 Patient Advocate Encounter   Received notification from [facility] that prior authorization for Amvuttra  is required.   PA submitted on CoverMyMeds Key [id number] Status is pending   Will continue to follow.   Tinnie Redman, PharmD, BCPS, BCCP, CPP Heart Failure Clinic Pharmacist [phone]

## 2024-05-09 NOTE — Progress Notes (Signed)
 [facility] Clinic Note   PCP: Dr Aureliano Saran [facility]) HF Cardiology: Dr. Rolan  Chief complaint: CHF  89 y.o. returns for followup of CHF.  I took care of his wife in the past.  He had been generally health up until this fall.  He developed worsening fatigue as well as exertional dyspnea.  His PCP started him on Lasix  20 mg daily, which helped his breathing.  Echo was done (cannot review as done at non-[facility] facility) showing severe LVH with a speckled pattern concerning for amyloidosis.  Also noted was EF of 20%, diffuse hypokinesis, and moderate RV dysfunction.  LHC/RHC was done in 12/20, showing no significant CAD and optimized filling pressures with low cardiac output.    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    He saw Dr. Onesimo for evaluation of monoclonal gammopathy.  He would not want bone marrow biopsy or endomyocardial biopsy, and he would not want chemotherapy if multiple myeloma were found.  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   Echo in 5/22 showed EF 35-40%, severe LVH, mildly decreased RV systolic function, mild RVH, mild AI, mild MR.   Echo 12/23, EF 35-40%, severe LVH, diffuse hypokinesis with apical sparing strain pattern consistent with amyloidosis, mildly decreased RV systolic function, mild MR, IVC normal.   Follow up 3/24, NYHA II-early III, volume stable. Labs showed hyperkalemia and Entresto  and spiro stoppled, Lasix  20 mg daily started.  Today he returns for HF follow up.  He reports poor energy level, thinks this has been gradually worsening.  He has poor balance but denies lightheadedness. He has tingling in his feet bilaterally.  He has not fallen.  He has dyspnea with moderate exertion like walking a long distance.  No problems getting dressed.  He tries to exercise for 25 minutes/day. Walks to dining room at [employer]. Weight down 6 lbs.   ECG  (personally reviewed): NSR, 1st degree AVB, LBBB  Labs (12/20): K 5.2 => 4.9, creatinine 1.26 => 1.32, BNP 628, hgb 14.4.  Serum immunofixation with IgA monoclonal protein (light chain).  Urine immunofixation negative.  Labs (1/21): K 5.2 => 4.5, creatinine 1.31 => 1.26 Labs (3/21): K 4.6, creatinine 1.44 Labs (5/21): K 4.8, creatinine 1.38 Labs (7/21): K 5, creatinine 1.74 Labs (2/22): K 5.1, creatinine 1.56 Labs (5/22): K 4.3, creatinine 1.24 Labs (8/22): BNP 667, K 4.7, creatinine 1.42 Labs (12/22): K 5.1, creatinine 1.48 Labs (8/23): BNP 315, LDL 82, K 4.9, creatinine 8.22 Labs (12/23): Scr 1.71, K 4.6  Labs (3/24): K 5.3, creatinine 1.76 Labs (2/25): K 4.8, creatinine 1.84, BNP 252  PMH: 1. HTN 2. Hyperlipidemia 3. Prostate cancer 4. Osteoarthritis 5. Macular degeneration.  6. Chronic systolic CHF:  - Echo (12/16): EF 55-60%, normal RV.  - Echo (11/20): Severe LVH with speckled pattern concerning for amyloidosis, EF 20% with diffuse hypokinesis, moderately decreased RV systolic function.  - LHC/RHC (12/20): No significant CAD.  Mean RA 2, PA 36/15, mean PCWP 14, CI 2.06.  - PYP scan: grade 3, H/CL ratio 2.8. Strongly suggestive of transthyretin amyloidosis.  Invitae gene testing negative for typical ATTR mutations, suggesting wild-type ATTR.  - Echo (5/22): EF 35-40%, severe LVH, mildly decreased RV systolic function, mild RVH, mild AI, mild MR.  - Echo (12/23): EF 35-40%, severe LVH, diffuse hypokinesis with apical sparing strain pattern consistent with amyloidosis, mildly decreased RV systolic function, mild MR, IVC normal.  7.  Monoclonal gammopathy 8. LBBB 9. CKD stage 3  SH: Widower, retired, lives in [place] at [place].  Nonsmoker, occasional ETOH (not heavy).   FH: No history of premature CAD or cardiomyopathy.   ROS: All systems reviewed and negative except as per HPI.   Current Outpatient Medications  Medication Sig Dispense Refill   cetirizine (ZYRTEC) 10  MG tablet Take 10 mg by mouth daily.     FARXIGA  10 MG TABS tablet TAKE 1 TABLET ONCE DAILY BEFORE BREAKFAST. 30 tablet 11   furosemide  (LASIX ) 20 MG tablet Take 1 tablet (20 mg total) by mouth daily. 90 tablet 3   Melatonin 10 MG CAPS Take 10 mg by mouth daily.     Multiple Vitamin (MULTIVITAMIN WITH MINERALS) TABS tablet Take 1 tablet by mouth at bedtime.     Multiple Vitamins-Minerals (PRESERVISION AREDS) CAPS Take by mouth.     simvastatin (ZOCOR) 20 MG tablet Take 20 mg by mouth at bedtime.     VYNDAMAX  61 MG CAPS TAKE 1 CAPSULE BY MOUTH 1 TIME A DAY. 30 capsule 11   ENTRESTO  24-26 MG Take 1 tablet by mouth 2 (two) times daily. 60 tablet 6   No current facility-administered medications for this encounter.   Wt Readings from Last 3 Encounters:  05/09/24 67.1 kg (148 lb)  11/25/23 69.9 kg (154 lb 3.2 oz)  12/29/22 68 kg (150 lb)   BP 120/70   Pulse 66   Wt 67.1 kg (148 lb)   SpO2 96%   BMI 21.24 kg/m   PHYSICAL EXAM: General: NAD Neck: No JVD, no thyromegaly or thyroid  nodule.  Lungs: Clear to auscultation bilaterally with normal respiratory effort. CV: Nondisplaced PMI.  Heart regular S1/S2, no S3/S4, no murmur.  No peripheral edema.  No carotid bruit.  Normal pedal pulses.  Abdomen: Soft, nontender, no hepatosplenomegaly, no distention.  Skin: Intact without lesions or rashes.  Neurologic: Alert and oriented x 3.  Psych: Normal affect. Extremities: No clubbing or cyanosis.  HEENT: Normal.   Assessment/Plan: 1. 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 Echo (11/20) with severe LVH and speckled pattern concerning for cardiac amyloidosis, EF 20%, moderately decreased RV systolic function.  LHC/RHC in 12/20 showed no significant CAD, filling pressures not elevated but cardiac output moderately depressed.  Appearance of myocardium is suggestive of amyloidosis. PYP scan was strongly suggestive of ATTR cardiac amyloidosis.   Echo in 5/22 showed EF 35-40%, severe LVH, mildly decreased RV systolic function, mild RVH, mild AI, mild MR.  Echo 12/23 showed EF 35-40%, severe LVH, diffuse hypokinesis with apical sparing strain pattern consistent with amyloidosis, mildly decreased RV systolic function, mild MR, IVC normal.  Chronic NYHA class II-III symptoms.  GDMT limited by hyperkalemia and CKD. - Continue Lasix  20 mg daily. BMET and BNP today. - Continue Farxiga  10 mg daily.  - I would like to get him back on Entresto , start 24/26 bid.  BMET in 10 days.  - Off Coreg  due to h/o bradycardia and fatigue - I will arrange for repeat echo.  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  Myocardial appearance on echo is suggestive of amyloidosis.  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  However, serum immunofixation did show a monoclonal IgA light chain.  I suspect that this is ATTR amyloidosis given the strong positivity of the PYP scan.  He was seen by hematology (Dr. Onesimo) given the monoclonal light chain.  He does not want aggressive workup of the  monoclonal gammopathy (no biopsies, would not want chemotherapy if multiple myeloma were found).  Invitae gene testing was negative for the common ATTR mutations, suggesting wild type ATTR amyloidosis.  Dr. 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  He has symptoms of peripheral neuropathy, especially loss of proprioceptive sensation with balance trouble.  - Continue tafamidis .   - Given progressive peripheral neuropathy (balance trouble) and exertional symptoms, I would like to start him on vutrisiran  in addition to tafamidis .  If we cannot get this, would make transition from tafamidis  to acoramidis.  3. Depression: He has not wanted pharmacologic treatment.  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 Off ? blocker due to h/o bradycardia. No syncope/presyncope.   Follow up in 3 months with APP  I spent 31 minutes  reviewing records, interviewing/examining patient, and managing orders.    Ezra Shuck,  05/09/2024

## 2024-05-09 NOTE — Patient Instructions (Signed)
 Medication Changes:  START Entresto  24/26 mg Twice daily   We will work on getting approval for Amvuttra  to treat your amyloidosis, please be aware this may take several weeks, we will contact you when approved  Lab Work:  Labs done today, your results will be available in MyChart, we will contact you for abnormal readings.  Your physician recommends that you return for lab work in: 1-2 weeks  Testing/Procedures:  Your physician has requested that you have an echocardiogram. Echocardiography is a painless test that uses sound waves to create images of your heart. It provides your doctor with information about the size and shape of your heart and how well your heart's chambers and valves are working. This procedure takes approximately one hour. There are no restrictions for this procedure. Please do NOT wear cologne, perfume, aftershave, or lotions (deodorant is allowed). Please arrive 15 minutes prior to your appointment time.  Please note: We ask at that you not bring children with you during ultrasound (echo/ vascular) testing. Due to room size and safety concerns, children are not allowed in the ultrasound rooms during exams. Our front office staff cannot provide observation of children in our lobby area while testing is being conducted. An adult accompanying a patient to their appointment will only be allowed in the ultrasound room at the discretion of the ultrasound technician under special circumstances. We apologize for any inconvenience.  Special Instructions // Education:  Do the following things EVERYDAY: Weigh yourself in the morning before breakfast. Write it down and keep it in a log. Take your medicines as prescribed Eat low salt foods--Limit salt (sodium) to 2000 mg per day.  Stay as active as you can everyday Limit all fluids for the day to less than 2 liters   Follow-Up in: 3 months    At the [facility], you and your health needs are our priority.  We have a designated team specialized in the treatment of Heart Failure. This Care Team includes your primary Heart Failure Specialized Cardiologist (physician), Advanced Practice Providers (APPs- Physician Assistants and Nurse Practitioners), and Pharmacist who all work together to provide you with the care you need, when you need it.   You may see any of the following providers on your designated Care Team at your next follow up:  Dr. Toribio Fuel Dr. Ezra Shuck Dr. Ria Commander Dr. Odis Brownie Greig Mosses, NP Caffie Shed, [place] [facility] [place], [place] Beckey Coe, NP [place] Lee, NP Tinnie Redman, PharmD   Please be sure to bring in all your medications bottles to every appointment.   Need to Contact Us :  If you have any questions or concerns before your next appointment please send us  a message through [place] or call our office at [phone].    TO LEAVE A MESSAGE FOR THE NURSE SELECT OPTION 2, PLEASE LEAVE A MESSAGE INCLUDING: YOUR NAME DATE OF BIRTH CALL BACK NUMBER REASON FOR CALL**this is important as we prioritize the call backs  YOU WILL RECEIVE A CALL BACK THE SAME DAY AS LONG AS YOU CALL BEFORE 4:00 PM

## 2024-05-09 NOTE — Telephone Encounter (Signed)
 Pharmacy Patient Advocate Encounter  Insurance verification completed.   The patient is insured through [employer] test claim for Entresto  (brand name). Currently a quantity of 180 is a 90 day supply and the co-pay is $0 .  This test claim was processed through [facility]- copay amounts may vary at other pharmacies due to pharmacy/plan contracts, or as the patient moves through the different stages of their insurance plan.   Corey JULIANNA Pa, CPhT

## 2024-05-10 ENCOUNTER — Other Ambulatory Visit: Payer: Self-pay

## 2024-05-10 ENCOUNTER — Other Ambulatory Visit (HOSPITAL_COMMUNITY): Payer: Self-pay

## 2024-05-10 ENCOUNTER — Encounter (HOSPITAL_COMMUNITY): Payer: Self-pay

## 2024-05-10 ENCOUNTER — Other Ambulatory Visit (HOSPITAL_COMMUNITY): Payer: Self-pay | Admitting: Pharmacy Technician

## 2024-05-10 ENCOUNTER — Other Ambulatory Visit (HOSPITAL_COMMUNITY): Payer: Self-pay | Admitting: Pharmacist

## 2024-05-10 MED ORDER — VUTRISIRAN SODIUM 25 MG/0.5ML ~~LOC~~ SOSY
25.0000 mg | PREFILLED_SYRINGE | SUBCUTANEOUS | 3 refills | Status: AC
  Filled 2024-05-10: qty 0.5, 90d supply, fill #0
  Filled 2024-08-04: qty 0.5, 90d supply, fill #1
  Filled 2024-11-01: qty 0.5, 90d supply, fill #2

## 2024-05-10 NOTE — Progress Notes (Signed)
 Specialty Pharmacy Initiation Note   Corey Harrison is a 89 y.o. male who will be followed by the specialty pharmacy service for RxSp Cardiology    Review of administration, indication, effectiveness, safety, potential side effects, storage/disposable, and missed dose instructions occurred today for patient's specialty medication(s) Vutrisiran  Sodium (AMVUTTRA )     Patient/Caregiver did not have any additional questions or concerns.   Patient's therapy is appropriate to: Initiate    Goals Addressed             This Visit's Progress    Slow Disease Progression       Patient is initiating therapy. Patient will maintain adherence         Kynadi Dragos CHRISTELLA Redman Specialty Pharmacist

## 2024-05-10 NOTE — Progress Notes (Signed)
 Specialty Pharmacy Initial Fill Coordination Note  Corey Harrison is a 89 y.o. male contacted today regarding initial fill of specialty medication(s) Vutrisiran  Sodium (AMVUTTRA )   Patient requested Courier to Provider Office   Delivery date: 05/16/24   Verified address: [facility] - [address] [place], [place], [place], [address]   Medication will be filled on Fri 08/15.   Patient is aware of $0 copayment.

## 2024-05-10 NOTE — Telephone Encounter (Signed)
 Advanced [facility] Failure Patient Advocate Encounter  Prior Authorization for Amvuttra  has been approved through [facility].    PA# [id number] Effective dates: 09/30/23 through 09/28/2024  Patients co-pay is $0.00  Patient is scheduled to come to pharmacy clinic for initial injection 05/19/24.   Tinnie Redman, PharmD, BCPS, BCCP, CPP Heart Failure Clinic Pharmacist [phone]

## 2024-05-13 ENCOUNTER — Other Ambulatory Visit: Payer: Self-pay

## 2024-05-13 DIAGNOSIS — M531 Cervicobrachial syndrome: Secondary | ICD-10-CM | POA: Diagnosis not present

## 2024-05-13 DIAGNOSIS — M9902 Segmental and somatic dysfunction of thoracic region: Secondary | ICD-10-CM | POA: Diagnosis not present

## 2024-05-13 DIAGNOSIS — M9901 Segmental and somatic dysfunction of cervical region: Secondary | ICD-10-CM | POA: Diagnosis not present

## 2024-05-19 ENCOUNTER — Ambulatory Visit (HOSPITAL_BASED_OUTPATIENT_CLINIC_OR_DEPARTMENT_OTHER)
Admission: RE | Admit: 2024-05-19 | Discharge: 2024-05-19 | Disposition: A | Discharge status: Home / Self Care | Source: Ambulatory Visit | Attending: Cardiology | Admitting: Cardiology

## 2024-05-19 ENCOUNTER — Ambulatory Visit (HOSPITAL_COMMUNITY)
Admission: RE | Admit: 2024-05-19 | Discharge: 2024-05-19 | Disposition: A | Discharge status: Home / Self Care | Source: Ambulatory Visit | Attending: Cardiology | Admitting: Cardiology

## 2024-05-19 DIAGNOSIS — D472 Monoclonal gammopathy: Secondary | ICD-10-CM | POA: Diagnosis not present

## 2024-05-19 DIAGNOSIS — E8582 Wild-type transthyretin-related (ATTR) amyloidosis: Secondary | ICD-10-CM

## 2024-05-19 DIAGNOSIS — Z79899 Other long term (current) drug therapy: Secondary | ICD-10-CM | POA: Insufficient documentation

## 2024-05-19 DIAGNOSIS — G629 Polyneuropathy, unspecified: Secondary | ICD-10-CM | POA: Diagnosis not present

## 2024-05-19 DIAGNOSIS — I5022 Chronic systolic (congestive) heart failure: Secondary | ICD-10-CM | POA: Diagnosis not present

## 2024-05-19 LAB — BASIC METABOLIC PANEL WITH GFR
Anion gap: 11 (ref 5–15)
BUN: 59 mg/dL — ABNORMAL HIGH (ref 8–23)
CO2: 26 mmol/L (ref 22–32)
Calcium: 9.5 mg/dL (ref 8.9–10.3)
Chloride: 105 mmol/L (ref 98–111)
Creatinine, Ser: 1.93 mg/dL — ABNORMAL HIGH (ref 0.61–1.24)
GFR, Estimated: 32 mL/min — ABNORMAL LOW (ref >60)
Glucose, Bld: 98 mg/dL (ref 70–99)
Potassium: 4.7 mmol/L (ref 3.5–5.1)
Sodium: 142 mmol/L (ref 135–145)

## 2024-05-19 MED ORDER — VUTRISIRAN SODIUM 25 MG/0.5ML ~~LOC~~ SOSY
25.0000 mg | PREFILLED_SYRINGE | Freq: Once | SUBCUTANEOUS | Status: AC
  Administered 2024-05-19: 25 mg via SUBCUTANEOUS

## 2024-05-19 NOTE — Patient Instructions (Signed)
 It was a pleasure seeing you today!  MEDICATIONS: -Start vitamin A supplement, at least 700-900 mcg daily (can be found as OTC supplement or in your multivitamin). Amvuttra  decreases serum vitamin A levels. -Call if you have questions about your medications.   NEXT APPOINTMENT: Return to clinic in 3 month with APP Clinic and repeat injection.  In general, to take care of your heart failure: -Limit your fluid intake to 2 Liters (half-gallon) per day.   -Limit your salt intake to ideally 2-3 grams (2000-3000 mg) per day. -Weigh yourself daily and record, and bring that weight diary to your next appointment.  (Weight gain of 2-3 pounds in 1 day typically means fluid weight.) -The medications for your heart are to help your heart and help you live longer.   -Please contact us  before stopping any of your heart medications.  Call the clinic at [phone] with questions or to reschedule future appointments.

## 2024-05-19 NOTE — Progress Notes (Signed)
 Advanced [facility] Failure Clinic Note   PCP: Dr Aureliano Saran [facility]) HF Cardiology: Dr. Rolan  HPI:  89 y.o. returns for followup of CHF.  He developed worsening fatigue as well as exertional dyspnea.  His PCP started him on Lasix  20 mg daily, which helped his breathing.  Echo was done (cannot review as done at non-[facility] facility) showing severe LVH with a speckled pattern concerning for amyloidosis.  Also noted was EF of 20%, diffuse hypokinesis, and moderate RV dysfunction.  LHC/RHC was done in 08/2019, showing no significant CAD and optimized filling pressures with low cardiac output.     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     He saw Dr. Onesimo for evaluation of monoclonal gammopathy.  He would not want bone marrow biopsy or endomyocardial biopsy, and he would not want chemotherapy if multiple myeloma were found.  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    Echo in 01/2021 showed EF 35-40%, severe LVH, mildly decreased RV systolic function, mild RVH, mild AI, mild MR.    Echo 08/2022, EF 35-40%, severe LVH, diffuse hypokinesis with apical sparing strain pattern consistent with amyloidosis, mildly decreased RV systolic function, mild MR, IVC normal.    Follow up 11/2022, NYHA II-early III, volume stable. Labs showed hyperkalemia and Entresto  and spironolactone  stopped, Lasix  20 mg daily started.   Returned for AHF follow up 05/09/24.  He reported poor energy level, thought this had been gradually worsening.  He noted poor balance but denied lightheadedness. He had tingling in his feet bilaterally.  He had not fallen.  He noted dyspnea with moderate exertion like walking a long distance.  No problems getting dressed.  He tries to exercise for 25 minutes/day. Walks to dining room at [employer]. Weight was down 6 lbs. Given progressive worsening HF symptoms and peripheral neuropathy, he was  prescribed Amvuttra .  Today he returns to HF clinic for administration of Amvuttra . This is the patient's first injection. Injection was administered in the L arm and patient tolerated injection well.    Assessment/Plan: 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  Myocardial appearance on echo is suggestive of amyloidosis.  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  However, serum immunofixation did show a monoclonal IgA light chain.  Suspect that this is ATTR amyloidosis given the strong positivity of the PYP scan.  He was seen by hematology (Dr. Onesimo) given the monoclonal light chain.  He does not want aggressive workup of the monoclonal gammopathy (no biopsies, would not want chemotherapy if multiple myeloma were found).  Invitae gene testing was negative for the common ATTR mutations, suggesting wild type ATTR amyloidosis.  Dr. 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  He has symptoms of peripheral neuropathy, especially loss of proprioceptive sensation with balance trouble.  - Continue tafamidis .   - Given progressive peripheral neuropathy (balance trouble) and exertional symptoms, he has been started on vutrisiran  in addition to tafamidis .   - Amvuttra  (vutrisiran ) injection administered in clinic today. Patient tolerated injection well. Provided patient counseling on Amvuttra . Most common side effects are injection site reactions, arthralgias and dyspnea. Patient is aware to return to clinic every 3 months for repeat injection. Amvuttra  will be obtained from [facility] and couriered to clinic for injection.  - Start vitamin A supplement, at least 700-900 mcg daily (can be found as OTC supplement or in MVI). Amvuttra  decreases serum vitamin  A levels.   Follow up 3 months for repeat injection.   Tinnie Redman, PharmD, BCPS, BCCP, CPP Heart Failure Clinic Pharmacist [phone]

## 2024-05-20 ENCOUNTER — Other Ambulatory Visit: Payer: Self-pay

## 2024-05-20 ENCOUNTER — Emergency Department (HOSPITAL_COMMUNITY)
Admission: EM | Admit: 2024-05-20 | Discharge: 2024-05-20 | Disposition: A | Discharge status: Home / Self Care | Attending: Emergency Medicine | Admitting: Emergency Medicine

## 2024-05-20 ENCOUNTER — Emergency Department (HOSPITAL_COMMUNITY)

## 2024-05-20 ENCOUNTER — Encounter (HOSPITAL_COMMUNITY): Payer: Self-pay

## 2024-05-20 DIAGNOSIS — N179 Acute kidney failure, unspecified: Secondary | ICD-10-CM | POA: Diagnosis not present

## 2024-05-20 DIAGNOSIS — M9901 Segmental and somatic dysfunction of cervical region: Secondary | ICD-10-CM | POA: Diagnosis not present

## 2024-05-20 DIAGNOSIS — I110 Hypertensive heart disease with heart failure: Secondary | ICD-10-CM | POA: Diagnosis not present

## 2024-05-20 DIAGNOSIS — I509 Heart failure, unspecified: Secondary | ICD-10-CM | POA: Diagnosis not present

## 2024-05-20 DIAGNOSIS — H538 Other visual disturbances: Secondary | ICD-10-CM

## 2024-05-20 DIAGNOSIS — Z8546 Personal history of malignant neoplasm of prostate: Secondary | ICD-10-CM | POA: Diagnosis not present

## 2024-05-20 DIAGNOSIS — M9902 Segmental and somatic dysfunction of thoracic region: Secondary | ICD-10-CM | POA: Diagnosis not present

## 2024-05-20 DIAGNOSIS — M531 Cervicobrachial syndrome: Secondary | ICD-10-CM | POA: Diagnosis not present

## 2024-05-20 DIAGNOSIS — R230 Cyanosis: Secondary | ICD-10-CM | POA: Diagnosis not present

## 2024-05-20 DIAGNOSIS — I959 Hypotension, unspecified: Secondary | ICD-10-CM | POA: Insufficient documentation

## 2024-05-20 DIAGNOSIS — R42 Dizziness and giddiness: Secondary | ICD-10-CM | POA: Diagnosis not present

## 2024-05-20 LAB — CBC WITH DIFFERENTIAL/PLATELET
Abs Immature Granulocytes: 0.01 K/uL (ref 0.00–0.07)
Basophils Absolute: 0 K/uL (ref 0.0–0.1)
Basophils Relative: 1 %
Eosinophils Absolute: 0.2 K/uL (ref 0.0–0.5)
Eosinophils Relative: 2 %
HCT: 45.2 % (ref 39.0–52.0)
Hemoglobin: 14.6 g/dL (ref 13.0–17.0)
Immature Granulocytes: 0 %
Lymphocytes Relative: 18 %
Lymphs Abs: 1.6 K/uL (ref 0.7–4.0)
MCH: 33.4 pg (ref 26.0–34.0)
MCHC: 32.3 g/dL (ref 30.0–36.0)
MCV: 103.4 fL — ABNORMAL HIGH (ref 80.0–100.0)
Monocytes Absolute: 1.1 K/uL — ABNORMAL HIGH (ref 0.1–1.0)
Monocytes Relative: 12 %
Neutro Abs: 5.9 K/uL (ref 1.7–7.7)
Neutrophils Relative %: 67 %
Platelets: 216 K/uL (ref 150–400)
RBC: 4.37 MIL/uL (ref 4.22–5.81)
RDW: 15.1 % (ref 11.5–15.5)
WBC: 8.7 K/uL (ref 4.0–10.5)
nRBC: 0 % (ref 0.0–0.2)

## 2024-05-20 LAB — I-STAT CHEM 8, ED
BUN: 59 mg/dL — ABNORMAL HIGH (ref 8–23)
Calcium, Ion: 1.1 mmol/L — ABNORMAL LOW (ref 1.15–1.40)
Chloride: 110 mmol/L (ref 98–111)
Creatinine, Ser: 2.2 mg/dL — ABNORMAL HIGH (ref 0.61–1.24)
Glucose, Bld: 77 mg/dL (ref 70–99)
HCT: 46 % (ref 39.0–52.0)
Hemoglobin: 15.6 g/dL (ref 13.0–17.0)
Potassium: 4.6 mmol/L (ref 3.5–5.1)
Sodium: 142 mmol/L (ref 135–145)
TCO2: 24 mmol/L (ref 22–32)

## 2024-05-20 LAB — TROPONIN I (HIGH SENSITIVITY): Troponin I (High Sensitivity): 75 ng/L — ABNORMAL HIGH (ref <18)

## 2024-05-20 LAB — COMPREHENSIVE METABOLIC PANEL WITH GFR
ALT: 14 U/L (ref 0–44)
AST: 29 U/L (ref 15–41)
Albumin: 4 g/dL (ref 3.5–5.0)
Alkaline Phosphatase: 45 U/L (ref 38–126)
Anion gap: 15 (ref 5–15)
BUN: 60 mg/dL — ABNORMAL HIGH (ref 8–23)
CO2: 19 mmol/L — ABNORMAL LOW (ref 22–32)
Calcium: 9.2 mg/dL (ref 8.9–10.3)
Chloride: 107 mmol/L (ref 98–111)
Creatinine, Ser: 2.09 mg/dL — ABNORMAL HIGH (ref 0.61–1.24)
GFR, Estimated: 29 mL/min — ABNORMAL LOW (ref >60)
Glucose, Bld: 81 mg/dL (ref 70–99)
Potassium: 4.7 mmol/L (ref 3.5–5.1)
Sodium: 141 mmol/L (ref 135–145)
Total Bilirubin: 0.9 mg/dL (ref 0.0–1.2)
Total Protein: 6.4 g/dL — ABNORMAL LOW (ref 6.5–8.1)

## 2024-05-20 LAB — I-STAT CG4 LACTIC ACID, ED: Lactic Acid, Venous: 1.4 mmol/L (ref 0.5–1.9)

## 2024-05-20 LAB — BRAIN NATRIURETIC PEPTIDE: B Natriuretic Peptide: 462.5 pg/mL — ABNORMAL HIGH (ref 0.0–100.0)

## 2024-05-20 NOTE — ED Provider Notes (Addendum)
 [place] [facility] Provider Note  MDM   HPI/ROS:  Corey Harrison is a 89 y.o. male with a medical history as below who presents with a brief episode of blurry vision today.  Patient reports he was on his way to lunch when he suddenly started feeling lightheaded with blurred vision.  Symptoms lasted approximately 20 minutes until EMS arrived.  His slowly resolved.  He denies any numbness tingling or weakness in his extremities, denies any slurred speech, facial droop.  He also denies any chest pain, shortness of breath or recent illness.  He states he does have some shortness of breath at baseline from his cardiac amyloid however has not worsened recently and has been able to tolerate his exercise without difficulty.  States he recently received a new medicine from cardiology yesterday via injection.  Unsure what the medication is or if this is a potential side effect.  Physical exam is notable for: - Well-appearing, hemodynamically intact.  Neurologically intact.  Clear lung sounds bilaterally with no lower extremity edema no rales  -Additional history obtained from EMS  Differentials include hypovolemia, cardiogenic (ischemia or arrhythmia), CVA, TIA, CHF, vasovagal, orthostasis, infection.    On exam patient is completely resolved he has no neurologic deficits, he states he feels that his baseline.  He has not had any chest pain or shortness of breath during this event.  On chart review he does have significant reduced ejection fraction 20%.  He has no lower extremity edema or rales on my exam.  Given the uncertainty of this etiology we will obtain broad workup.  His blood pressure seem to respond with IV fluids from EMS.  Results of his workup are as below.  Normal lactic lowering my concern for shock.  Does not meet SIRS criteria, defer antibiotics at this time.  This appears to may have been a transient event raising concern for orthostasis versus vasovagal  or TIA..  As he has a benign neurologic exam do not believe advanced imaging is necessary at this time.  His troponin is elevated to 75 and BNP elevated from baseline as well.  Still have some uncertainty or surrounding the causation of his hypotension on arrival however given his poor overall cardiac health and other risk factors do believe he will require admission.  After discussing this with patient he adamantly declines admission and states that he feels much improved.  We did discuss the risk and benefits of admission versus discharge and he is able to reiterate those to me.  We discussed that if there is worsening of his heart failure and his poor function he may ultimately do his demise or worsening symptoms.  He reports that despite feeling fine now he understands that his symptoms may worsen however given his old age and chronic comorbidities he would much rather spend his time at home.  He seems to have appropriate capacity and good judgment.  We discussed these risks with his family in the room who also discussed this with him.  Via shared decision making the patient was discharged with strict return precautions and instructions to follow-up with his cardiologist.  Interpretations, interventions, and the patient's course of care are documented below.    Clinical Course as of 05/20/24 1535  Fri May 20, 2024  1343 EKG 12-Lead Rate 90, normal axis with prolonged QT, left bundle branch block, no Sgarbossa.  Consistent with prior [RC]  1447 Creatinine(!): 2.20 Mild AKI, [RC]  1450 Lactic Acid, Venous: 1.4 . [  RC]  1516 Troponin I (High Sensitivity)(!): 75 [RC]  1516 B Natriuretic Peptide(!): 462.5 [RC]  1516 B Natriuretic Peptide(!): 462.5 Elevated from baseline [RC]  1516 EKG 12-Lead No pneumonia, pulmonary edema [RC]    Clinical Course User Index [RC] Sharyne Darina RAMAN, MD      Disposition:  discharge  Clinical Impression:  1. Hypotension, unspecified hypotension type   2.  Blurred vision     The plan for this patient was discussed with Dr. Levander, who voiced agreement and who oversaw evaluation and treatment of this patient.   Clinical Complexity A medically appropriate history, review of systems, and physical exam was performed.  My independent interpretations of EKG, labs, and radiology are documented in the ED course above.   If decision rules were used in this patient's evaluation, they are listed below.   Click here for ABCD2, HEART and other calculatorsREFRESH Note before signing   Patient's presentation is most consistent with acute presentation with potential threat to life or bodily function.  Medical Decision Making Amount and/or Complexity of Data Reviewed Labs: ordered. Decision-making details documented in ED Course. Radiology: ordered. ECG/medicine tests:  Decision-making details documented in ED Course.  Risk Decision regarding hospitalization.    HPI/ROS      See MDM section for pertinent HPI and ROS. A complete ROS was performed with pertinent positives/negatives noted above.   Past Medical History:  Diagnosis Date   Cancer [facility]) 1996   prostate   CHF (congestive heart failure) (HCC)    Hypertension     Past Surgical History:  Procedure Laterality Date   CARPAL TUNNEL RELEASE Right dec 2005   CARPAL TUNNEL RELEASE Left jan 2006   COLONOSCOPY WITH PROPOFOL  N/A 12/20/2013   Procedure: COLONOSCOPY WITH PROPOFOL ;  Surgeon: Gladis MARLA Louder, MD;  Location: [facility] END[facility];  Service: Endoscopy;  Laterality: N/A;   endoscopic sinus surgery  sept 1993   EYE SURGERY  06-2012   right eye cataract   HERNIA REPAIR  oct 1996   diaphragmatic   HERNIA REPAIR Right jan 2003   left hernia repair  dec 2007   prostate surgery for cancer  02-1998   rectal fistula repair  1974   RIGHT/LEFT HEART CATH AND CORONARY ANGIOGRAPHY N/A 09/28/2019   Procedure: RIGHT/LEFT HEART CATH AND CORONARY ANGIOGRAPHY;  Surgeon: Rolan Ezra RAMAN, MD;  Location:  [facility] INVASIVE CV LAB;  Service: Cardiovascular;  Laterality: N/A;   ROTATOR CUFF REPAIR Left       Physical Exam   Vitals:   05/20/24 1334 05/20/24 1335 05/20/24 1345  BP: (!) 132/107  (!) 106/58  Pulse: 92  92  Resp: 18  18  Temp: 97.6 F (36.4 C)    TempSrc: Oral    SpO2: 97%  97%  Weight:  65.8 kg   Height:  5' 10 (1.778 m)     Physical Exam Vitals and nursing note reviewed.  Constitutional:      General: He is not in acute distress.    Appearance: He is well-developed.  HENT:     Head: Normocephalic and atraumatic.  Eyes:     Conjunctiva/sclera: Conjunctivae normal.  Cardiovascular:     Rate and Rhythm: Normal rate and regular rhythm.     Heart sounds: No murmur heard. Pulmonary:     Effort: Pulmonary effort is normal. No respiratory distress.     Breath sounds: Normal breath sounds.  Abdominal:     Palpations: Abdomen is soft.     Tenderness: There  is no abdominal tenderness.  Musculoskeletal:        General: No swelling.     Cervical back: Neck supple.  Skin:    General: Skin is warm and dry.     Capillary Refill: Capillary refill takes less than 2 seconds.  Neurological:     General: No focal deficit present.     Mental Status: He is alert. Mental status is at baseline.     GCS: GCS eye subscore is 4. GCS verbal subscore is 5. GCS motor subscore is 6.     Cranial Nerves: Cranial nerves 2-12 are intact.     Sensory: Sensation is intact.     Motor: Motor function is intact.  Psychiatric:        Mood and Affect: Mood normal.      Procedures   If procedures were preformed on this patient, they are listed below:  Procedures   @BBSIG @   Please note that this documentation was produced with the assistance of voice-to-text technology and may contain errors.    Sharyne Darina RAMAN, MD 05/20/24 1537    Sharyne Darina RAMAN, MD 05/20/24 1551    Sharyne Darina RAMAN, MD 05/20/24 1557    Levander Houston, MD 05/23/24 [phone]

## 2024-05-20 NOTE — ED Triage Notes (Signed)
 PT BIB [facility] from parking lot of restaurant for complaint of double vision, dizziness and weakness at 1225. PT has Hx of cardiac amyloidosis and recently had a medication yesterday w/o any of the s/s experienced today. BP at scene was 64/42, received 1L NS en route. PT aox4, denies CP, SOB or any s/s at time of call.   113/52BP 88 HR NSR, 97% on RA, RR 18, CBG 104

## 2024-05-25 LAB — CULTURE, BLOOD (ROUTINE X 2)
Culture: NO GROWTH
Culture: NO GROWTH
Special Requests: ADEQUATE

## 2024-06-02 ENCOUNTER — Other Ambulatory Visit: Payer: Self-pay

## 2024-06-03 DIAGNOSIS — M531 Cervicobrachial syndrome: Secondary | ICD-10-CM | POA: Diagnosis not present

## 2024-06-03 DIAGNOSIS — M9902 Segmental and somatic dysfunction of thoracic region: Secondary | ICD-10-CM | POA: Diagnosis not present

## 2024-06-03 DIAGNOSIS — M9901 Segmental and somatic dysfunction of cervical region: Secondary | ICD-10-CM | POA: Diagnosis not present

## 2024-06-07 ENCOUNTER — Ambulatory Visit (HOSPITAL_COMMUNITY)
Admission: RE | Admit: 2024-06-07 | Discharge: 2024-06-07 | Disposition: A | Discharge status: Home / Self Care | Source: Ambulatory Visit | Attending: Cardiology | Admitting: Cardiology

## 2024-06-07 DIAGNOSIS — I080 Rheumatic disorders of both mitral and aortic valves: Secondary | ICD-10-CM | POA: Insufficient documentation

## 2024-06-07 DIAGNOSIS — I5022 Chronic systolic (congestive) heart failure: Secondary | ICD-10-CM | POA: Insufficient documentation

## 2024-06-07 DIAGNOSIS — I371 Nonrheumatic pulmonary valve insufficiency: Secondary | ICD-10-CM | POA: Insufficient documentation

## 2024-06-07 DIAGNOSIS — I110 Hypertensive heart disease with heart failure: Secondary | ICD-10-CM | POA: Insufficient documentation

## 2024-06-07 LAB — ECHOCARDIOGRAM COMPLETE
AR max vel: 2.73 cm2
AV Area VTI: 2.36 cm2
AV Area mean vel: 2.7 cm2
AV Mean grad: 3 mmHg
AV Peak grad: 4.4 mmHg
Ao pk vel: 1.05 m/s
Area-P 1/2: 5.13 cm2
S' Lateral: 3.6 cm

## 2024-06-08 ENCOUNTER — Telehealth (HOSPITAL_COMMUNITY): Payer: Self-pay | Admitting: *Deleted

## 2024-06-08 ENCOUNTER — Other Ambulatory Visit: Payer: Self-pay

## 2024-06-08 NOTE — Telephone Encounter (Signed)
 Called patient per Dr. Rolan with following echo results:  EF 30-35% with mild LVH. Think this is fairly stable.   Pt verbalized understanding of same. No further questions at this time.

## 2024-06-17 DIAGNOSIS — M9902 Segmental and somatic dysfunction of thoracic region: Secondary | ICD-10-CM | POA: Diagnosis not present

## 2024-06-17 DIAGNOSIS — M9901 Segmental and somatic dysfunction of cervical region: Secondary | ICD-10-CM | POA: Diagnosis not present

## 2024-06-17 DIAGNOSIS — M531 Cervicobrachial syndrome: Secondary | ICD-10-CM | POA: Diagnosis not present

## 2024-06-28 DIAGNOSIS — Z23 Encounter for immunization: Secondary | ICD-10-CM | POA: Diagnosis not present

## 2024-07-01 DIAGNOSIS — M9901 Segmental and somatic dysfunction of cervical region: Secondary | ICD-10-CM | POA: Diagnosis not present

## 2024-07-01 DIAGNOSIS — M531 Cervicobrachial syndrome: Secondary | ICD-10-CM | POA: Diagnosis not present

## 2024-07-01 DIAGNOSIS — M9902 Segmental and somatic dysfunction of thoracic region: Secondary | ICD-10-CM | POA: Diagnosis not present

## 2024-07-19 DIAGNOSIS — D1801 Hemangioma of skin and subcutaneous tissue: Secondary | ICD-10-CM | POA: Diagnosis not present

## 2024-07-19 DIAGNOSIS — L812 Freckles: Secondary | ICD-10-CM | POA: Diagnosis not present

## 2024-07-19 DIAGNOSIS — L570 Actinic keratosis: Secondary | ICD-10-CM | POA: Diagnosis not present

## 2024-07-19 DIAGNOSIS — Z85828 Personal history of other malignant neoplasm of skin: Secondary | ICD-10-CM | POA: Diagnosis not present

## 2024-07-19 DIAGNOSIS — L821 Other seborrheic keratosis: Secondary | ICD-10-CM | POA: Diagnosis not present

## 2024-07-22 DIAGNOSIS — M531 Cervicobrachial syndrome: Secondary | ICD-10-CM | POA: Diagnosis not present

## 2024-07-22 DIAGNOSIS — M9902 Segmental and somatic dysfunction of thoracic region: Secondary | ICD-10-CM | POA: Diagnosis not present

## 2024-07-22 DIAGNOSIS — M9901 Segmental and somatic dysfunction of cervical region: Secondary | ICD-10-CM | POA: Diagnosis not present

## 2024-08-04 ENCOUNTER — Other Ambulatory Visit (HOSPITAL_COMMUNITY): Payer: Self-pay

## 2024-08-04 ENCOUNTER — Other Ambulatory Visit: Payer: Self-pay

## 2024-08-04 NOTE — Progress Notes (Deleted)
 Specialty Pharmacy Refill Coordination Note  Corey Harrison is a 89 y.o. male assessed today regarding refills of clinic administered specialty medication(s) Vutrisiran  Sodium (AMVUTTRA )   Clinic requested Courier to Provider Office   Delivery date: 08/10/24   Verified address: [facility] [address] [place], [place] [address]   Medication will be filled on: 08/09/24

## 2024-08-09 ENCOUNTER — Other Ambulatory Visit: Payer: Self-pay

## 2024-08-09 ENCOUNTER — Encounter (HOSPITAL_COMMUNITY)

## 2024-08-09 NOTE — Progress Notes (Signed)
 Specialty Pharmacy Refill Coordination Note  Corey Harrison is a 89 y.o. male assessed today regarding refills of clinic administered specialty medication(s) Vutrisiran  Sodium (AMVUTTRA )   Clinic requested Courier to Provider Office   Delivery date: 08/11/24   Verified address: [facility] [address] [place], [place] [address]   Medication will be filled on: 08/10/24

## 2024-08-10 ENCOUNTER — Other Ambulatory Visit: Payer: Self-pay

## 2024-08-10 ENCOUNTER — Other Ambulatory Visit (HOSPITAL_COMMUNITY): Payer: Self-pay

## 2024-08-11 NOTE — Progress Notes (Signed)
 Medication has been picked up and is available in office for upcoming appointment.

## 2024-08-12 ENCOUNTER — Telehealth (HOSPITAL_COMMUNITY): Payer: Self-pay

## 2024-08-12 NOTE — Telephone Encounter (Signed)
 Called to confirm/remind patient of their appointment at the [facility] on 08/15/24.   Appointment:   [] Confirmed  [] Left mess   [] No answer/No voice mail  [x] VM Full/unable to leave message  [] Phone not in service

## 2024-08-15 ENCOUNTER — Ambulatory Visit (HOSPITAL_COMMUNITY)
Admission: RE | Admit: 2024-08-15 | Discharge: 2024-08-15 | Disposition: A | Discharge status: Home / Self Care | Source: Ambulatory Visit | Attending: Family Medicine | Admitting: Family Medicine

## 2024-08-15 ENCOUNTER — Encounter (HOSPITAL_COMMUNITY): Payer: Self-pay

## 2024-08-15 VITALS — BP 122/74 | HR 78 | Ht 70.0 in | Wt 148.2 lb

## 2024-08-15 DIAGNOSIS — I5022 Chronic systolic (congestive) heart failure: Secondary | ICD-10-CM | POA: Diagnosis not present

## 2024-08-15 DIAGNOSIS — I447 Left bundle-branch block, unspecified: Secondary | ICD-10-CM | POA: Insufficient documentation

## 2024-08-15 DIAGNOSIS — E8582 Wild-type transthyretin-related (ATTR) amyloidosis: Secondary | ICD-10-CM | POA: Diagnosis not present

## 2024-08-15 DIAGNOSIS — F32A Depression, unspecified: Secondary | ICD-10-CM | POA: Diagnosis not present

## 2024-08-15 DIAGNOSIS — E854 Organ-limited amyloidosis: Secondary | ICD-10-CM | POA: Diagnosis present

## 2024-08-15 DIAGNOSIS — Z79899 Other long term (current) drug therapy: Secondary | ICD-10-CM | POA: Insufficient documentation

## 2024-08-15 DIAGNOSIS — Z7984 Long term (current) use of oral hypoglycemic drugs: Secondary | ICD-10-CM | POA: Diagnosis not present

## 2024-08-15 DIAGNOSIS — R2689 Other abnormalities of gait and mobility: Secondary | ICD-10-CM | POA: Diagnosis not present

## 2024-08-15 DIAGNOSIS — I440 Atrioventricular block, first degree: Secondary | ICD-10-CM | POA: Insufficient documentation

## 2024-08-15 DIAGNOSIS — Z87898 Personal history of other specified conditions: Secondary | ICD-10-CM

## 2024-08-15 DIAGNOSIS — I43 Cardiomyopathy in diseases classified elsewhere: Secondary | ICD-10-CM | POA: Diagnosis present

## 2024-08-15 DIAGNOSIS — I428 Other cardiomyopathies: Secondary | ICD-10-CM | POA: Diagnosis not present

## 2024-08-15 LAB — BASIC METABOLIC PANEL WITH GFR
Anion gap: 12 (ref 5–15)
BUN: 66 mg/dL — ABNORMAL HIGH (ref 8–23)
CO2: 25 mmol/L (ref 22–32)
Calcium: 9.2 mg/dL (ref 8.9–10.3)
Chloride: 105 mmol/L (ref 98–111)
Creatinine, Ser: 2.21 mg/dL — ABNORMAL HIGH (ref 0.61–1.24)
GFR, Estimated: 27 mL/min — ABNORMAL LOW (ref >60)
Glucose, Bld: 101 mg/dL — ABNORMAL HIGH (ref 70–99)
Potassium: 4.5 mmol/L (ref 3.5–5.1)
Sodium: 142 mmol/L (ref 135–145)

## 2024-08-15 MED ORDER — VUTRISIRAN SODIUM 25 MG/0.5ML ~~LOC~~ SOSY
25.0000 mg | PREFILLED_SYRINGE | Freq: Once | SUBCUTANEOUS | Status: AC
  Administered 2024-08-15: 25 mg via SUBCUTANEOUS

## 2024-08-15 NOTE — Patient Instructions (Addendum)
 Thank you for coming in today  If you had labs drawn today, any labs that are abnormal the clinic will call you No news is good news  Medications: No changes  Follow up appointments:  Your physician recommends that you schedule a follow-up appointment in:  3 months With Dr. Mclean you will need your Amvuttra  injection on this appointment Please call our office to schedule the follow-up appointment in December 2025 for February 2026.    Do the following things EVERYDAY: Weigh yourself in the morning before breakfast. Write it down and keep it in a log. Take your medicines as prescribed Eat low salt foods--Limit salt (sodium) to 2000 mg per day.  Stay as active as you can everyday Limit all fluids for the day to less than 2 liters   At the [facility], you and your health needs are our priority. As part of our continuing mission to provide you with exceptional heart care, we have created designated Provider Care Teams. These Care Teams include your primary Cardiologist (physician) and Advanced Practice Providers (APPs- Physician Assistants and Nurse Practitioners) who all work together to provide you with the care you need, when you need it.   You may see any of the following providers on your designated Care Team at your next follow up: Dr Toribio Fuel Dr Ezra Shuck Dr. Ria Gardenia Greig Lenetta, NP Caffie Shed, [place] [facility] [place], [place] Beckey Coe, NP Tinnie Redman, PharmD   Please be sure to bring in all your medications bottles to every appointment.    Thank you for choosing [facility]  If you have any questions or concerns before your next appointment please send us  a message through [place] or call our office at [phone].    TO LEAVE A MESSAGE FOR THE NURSE SELECT OPTION 2, PLEASE LEAVE A MESSAGE INCLUDING: YOUR NAME DATE OF BIRTH CALL BACK NUMBER REASON FOR CALL**this is  important as we prioritize the call backs  YOU WILL RECEIVE A CALL BACK THE SAME DAY AS LONG AS YOU CALL BEFORE 4:00 PM

## 2024-08-15 NOTE — Progress Notes (Signed)
 [facility] Note   PCP: Dr Aureliano Saran [facility]) HF Cardiology: Dr. Rolan  HPI: 89 y.o. returns for followup of CHF.  I took care of his wife in the past.  He had been generally health up until this fall.  He developed worsening fatigue as well as exertional dyspnea.  His PCP started him on Lasix  20 mg daily, which helped his breathing.  Echo was done (cannot review as done at non-[facility] facility) showing severe LVH with a speckled pattern concerning for amyloidosis.  Also noted was EF of 20%, diffuse hypokinesis, and moderate RV dysfunction.  LHC/RHC was done in 12/20, showing no significant CAD and optimized filling pressures with low cardiac output.    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    He saw Dr. Onesimo for evaluation of monoclonal gammopathy.  He would not want bone marrow biopsy or endomyocardial biopsy, and he would not want chemotherapy if multiple myeloma were found.  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   Echo in 5/22 showed EF 35-40%, severe LVH, mildly decreased RV systolic function, mild RVH, mild AI, mild MR.   Echo 12/23, EF 35-40%, severe LVH, diffuse hypokinesis with apical sparing strain pattern consistent with amyloidosis, mildly decreased RV systolic function, mild MR, IVC normal.   Follow up 3/24, NYHA II-early III, volume stable. Labs showed hyperkalemia and Entresto  and spiro stoppled, Lasix  20 mg daily started.  Today he returns for HF follow up. Overall feeling fair. He is not SOB walking on flat ground. Continues to struggle with balance and dizziness, no falls. Does light weights and stretching at facility. Previously an avid runner.  Denies palpitations, abnormal bleeding, CP, edema, or PND/Orthopnea. Appetite ok. Taking all medications.   ECG (personally reviewed): none ordered today  Labs (12/20): K 5.2 => 4.9, creatinine 1.26 => 1.32,  BNP 628, hgb 14.4.  Serum immunofixation with IgA monoclonal protein (light chain).  Urine immunofixation negative.  Labs (3/24): K 5.3, creatinine 1.76 Labs (2/25): K 4.8, creatinine 1.84, BNP 252 Labs (8/25): K 4.7, creatinine 2.09, LDL 77  PMH: 1. HTN 2. Hyperlipidemia 3. Prostate cancer 4. Osteoarthritis 5. Macular degeneration.  6. Chronic systolic CHF:  - Echo (12/16): EF 55-60%, normal RV.  - Echo (11/20): Severe LVH with speckled pattern concerning for amyloidosis, EF 20% with diffuse hypokinesis, moderately decreased RV systolic function.  - LHC/RHC (12/20): No significant CAD.  Mean RA 2, PA 36/15, mean PCWP 14, CI 2.06.  - PYP scan: grade 3, H/CL ratio 2.8. Strongly suggestive of transthyretin amyloidosis.  Invitae gene testing negative for typical ATTR mutations, suggesting wild-type ATTR.  - Echo (5/22): EF 35-40%, severe LVH, mildly decreased RV systolic function, mild RVH, mild AI, mild MR.  - Echo (12/23): EF 35-40%, severe LVH, diffuse hypokinesis with apical sparing strain pattern consistent with amyloidosis, mildly decreased RV systolic function, mild MR, IVC normal.  7. Monoclonal gammopathy 8. LBBB 9. CKD stage 3  SH: Widower, retired, lives in [place].  Nonsmoker, occasional ETOH (not heavy).   FH: No history of premature CAD or cardiomyopathy.   ROS: All systems reviewed and negative except as per HPI.   Current Outpatient Medications  Medication Sig Dispense Refill   acetaminophen  (TYLENOL ) 500 MG tablet Take 1,000 mg by mouth every 8 (eight) hours as needed for mild pain (pain score 1-3).     ENTRESTO  24-26 MG Take 1 tablet by  mouth 2 (two) times daily. 60 tablet 6   FARXIGA  10 MG TABS tablet TAKE 1 TABLET ONCE DAILY BEFORE BREAKFAST. 30 tablet 11   furosemide  (LASIX ) 20 MG tablet Take 1 tablet (20 mg total) by mouth daily. 90 tablet 3   levocetirizine (XYZAL) 5 MG tablet Take 5 mg by mouth every evening.     Melatonin 10 MG CAPS Take 10 mg  by mouth daily.     Multiple Vitamin (MULTIVITAMIN WITH MINERALS) TABS tablet Take 1 tablet by mouth at bedtime.     Multiple Vitamins-Minerals (PRESERVISION AREDS) CAPS Take by mouth.     simvastatin (ZOCOR) 20 MG tablet Take 20 mg by mouth at bedtime.     vutrisiran  sodium (AMVUTTRA ) 25 MG/0.5ML syringe Inject 0.5 mLs (25 mg total) into the skin every 3 (three) months. 0.5 mL 3   VYNDAMAX  61 MG CAPS TAKE 1 CAPSULE BY MOUTH 1 TIME A DAY. 30 capsule 11   No current facility-administered medications for this encounter.   Wt Readings from Last 3 Encounters:  08/15/24 67.2 kg  05/20/24 65.8 kg  05/09/24 67.1 kg   BP 122/74   Pulse 78   Ht 5' 10 (1.778 m)   Wt 67.2 kg   SpO2 98%   BMI 21.26 kg/m   PHYSICAL EXAM: General:  NAD. No resp difficulty, walked into clinic, 89. HEENT: Normal Neck: Supple. No JVD. Cor: Regular rate & rhythm. No rubs, gallops or murmurs. Lungs: Clear Abdomen: Soft, nontender, nondistended.  Extremities: No cyanosis, clubbing, rash, edema Neuro: Alert & oriented x 3, moves all 4 extremities w/o difficulty. Affect pleasant.  Assessment/Plan: 1. 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 Echo (11/20) with severe LVH and speckled pattern concerning for cardiac amyloidosis, EF 20%, moderately decreased RV systolic function.  LHC/RHC in 12/20 showed no significant CAD, filling pressures not elevated but cardiac output moderately depressed.  Appearance of myocardium is suggestive of amyloidosis. PYP scan was strongly suggestive of ATTR cardiac amyloidosis.  Echo in 5/22 showed EF 35-40%, severe LVH, mildly decreased RV systolic function, mild RVH, mild AI, mild MR.  Echo 12/23 showed EF 35-40%, severe LVH, diffuse hypokinesis with apical sparing strain pattern consistent with amyloidosis, mildly decreased RV systolic function, mild MR, IVC normal. Echo 9/25 showed EF 30-35% Chronic NYHA class II-III  symptoms.  GDMT limited by hyperkalemia and CKD. - Continue Lasix  20 mg daily. BMET today. - Continue Farxiga  10 mg daily.  - Continue Entresto  24/26 bid. - Remains off Coreg  due to h/o bradycardia and fatigue 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  Myocardial appearance on echo is suggestive of amyloidosis.  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  However, serum immunofixation did show a monoclonal IgA light chain.  Suspect that this is ATTR amyloidosis given the strong positivity of the PYP scan.  He was seen by hematology (Dr. Onesimo) given the monoclonal light chain.  He does not want aggressive workup of the monoclonal gammopathy (no biopsies, would not want chemotherapy if multiple myeloma were found).  Invitae gene testing was negative for the common ATTR mutations, suggesting wild type ATTR amyloidosis.  Dr. 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  He has symptoms of peripheral neuropathy, especially loss of proprioceptive sensation with balance trouble.  - Continue tafamidis .   - Continue vultrisiran injections. 3. Depression: Flat affect. Was very active, enjoyed running and lifting weights. He has not wanted pharmacologic treatment.  4. Conduction system  disease: known LBBB and 1st degree AVB, likely related to amyloidosis. Off ? blocker due to h/o bradycardia. No syncope/presyncope.   Follow up in 3 months with Dr. Rolan.  Harlene Gainer, FNP-BC 08/15/2024

## 2024-08-15 NOTE — Progress Notes (Signed)
 Advanced [facility] Failure Clinic Note   PCP: Dr Aureliano Saran [facility]) HF Cardiology: Dr. Rolan  HPI:  89 y.o. returns for followup of CHF.  He developed worsening fatigue as well as exertional dyspnea.  His PCP started him on Lasix  20 mg daily, which helped his breathing.  Echo was done (cannot review as done at non-[facility] facility) showing severe LVH with a speckled pattern concerning for amyloidosis.  Also noted was EF of 20%, diffuse hypokinesis, and moderate RV dysfunction.  LHC/RHC was done in 08/2019, showing no significant CAD and optimized filling pressures with low cardiac output.     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     He saw Dr. Onesimo for evaluation of monoclonal gammopathy.  He would not want bone marrow biopsy or endomyocardial biopsy, and he would not want chemotherapy if multiple myeloma were found.  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    Echo in 01/2021 showed EF 35-40%, severe LVH, mildly decreased RV systolic function, mild RVH, mild AI, mild MR.    Echo 08/2022, EF 35-40%, severe LVH, diffuse hypokinesis with apical sparing strain pattern consistent with amyloidosis, mildly decreased RV systolic function, mild MR, IVC normal.    Follow up 11/2022, NYHA II-early III, volume stable. Labs showed hyperkalemia and Entresto  and spironolactone  stopped, Lasix  20 mg daily started.   Returned for AHF follow up 05/09/24.  He reported poor energy level, thought this had been gradually worsening.  He noted poor balance but denied lightheadedness. He had tingling in his feet bilaterally.  He had not fallen.  He noted dyspnea with moderate exertion like walking a long distance.  No problems getting dressed.  He tries to exercise for 25 minutes/day. Walks to dining room at [employer]. Weight was down 6 lbs. Given progressive worsening HF symptoms and peripheral neuropathy, he was  prescribed Amvuttra .  Today he returns to HF clinic for administration of Amvuttra . This is the patient's first injection. Injection was administered in the R arm and patient tolerated injection well.    Assessment/Plan: 1. Cardiac amyloidosis: Myocardial appearance on echo is suggestive of amyloidosis.  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  However, serum immunofixation did show a monoclonal IgA light chain.  Suspect that this is ATTR amyloidosis given the strong positivity of the PYP scan.  He was seen by hematology (Dr. Onesimo) given the monoclonal light chain.  He does not want aggressive workup of the monoclonal gammopathy (no biopsies, would not want chemotherapy if multiple myeloma were found).  Invitae gene testing was negative for the common ATTR mutations, suggesting wild type ATTR amyloidosis.  Dr. 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  He has symptoms of peripheral neuropathy, especially loss of proprioceptive sensation with balance trouble.  - Continue tafamidis .   - Given progressive peripheral neuropathy (balance trouble) and exertional symptoms, he has been started on vutrisiran  in addition to tafamidis .   - Amvuttra  (vutrisiran ) injection administered in clinic today. Patient tolerated injection well. Provided patient counseling on Amvuttra . Most common side effects are injection site reactions, arthralgias and dyspnea. Patient is aware to return to clinic every 3 months for repeat injection. Amvuttra  [facility] be obtained from [facility] and couriered to clinic for injection.  - Continue vitamin A supplement, at least 700-900 mcg daily (can be found as OTC supplement or in MVI). Amvuttra  decreases serum vitamin A levels.  Follow up 3 months for repeat injection.  Please do not hesitate to reach out with questions or concerns,  Jaun Bash, PharmD, CPP, BCPS, [facility] Heart Failure Pharmacist  Phone - [phone] 08/15/2024 2:48 PM

## 2024-08-16 ENCOUNTER — Ambulatory Visit (HOSPITAL_COMMUNITY): Payer: Self-pay | Admitting: Family Medicine

## 2024-09-12 ENCOUNTER — Other Ambulatory Visit (HOSPITAL_COMMUNITY): Payer: Self-pay

## 2024-09-12 ENCOUNTER — Telehealth (HOSPITAL_COMMUNITY): Payer: Self-pay

## 2024-09-12 NOTE — Telephone Encounter (Signed)
 Advanced Heart Failure Patient Advocate Encounter  Prior authorization for Vyndamax  has been submitted and approved. Test billing returns refill too soon rejection; unable to confirm copay at this time.  KeyBETHA DELLEN Effective: 09/30/2023 to 09/28/2025  Rachel DEL, CPhT Rx Patient Advocate Phone: [phone]

## 2024-10-06 ENCOUNTER — Other Ambulatory Visit (HOSPITAL_COMMUNITY): Payer: Self-pay

## 2024-10-06 ENCOUNTER — Telehealth (HOSPITAL_COMMUNITY): Payer: Self-pay | Admitting: Pharmacist

## 2024-10-06 NOTE — Telephone Encounter (Signed)
 Patient Advocate Encounter   Received notification from [facility] that prior authorization for Amvuttra  is required.   PA submitted on CoverMyMeds Key [id number] Status is pending   Will continue to follow.  Tinnie Redman, PharmD, BCPS, BCCP, CPP Heart Failure Clinic Pharmacist [phone]

## 2024-10-07 NOTE — Telephone Encounter (Signed)
 Advanced Heart Failure Patient Advocate Encounter  Prior Authorization for Amvuttra  has been approved.    Effective dates: 09/29/24 through 09/28/2025  Tinnie Redman, PharmD, BCPS, BCCP, CPP Heart Failure Clinic Pharmacist [phone]

## 2024-10-10 ENCOUNTER — Other Ambulatory Visit (HOSPITAL_COMMUNITY): Payer: Self-pay

## 2024-11-01 ENCOUNTER — Other Ambulatory Visit: Payer: Self-pay | Admitting: Pharmacy Technician

## 2024-11-01 ENCOUNTER — Other Ambulatory Visit: Payer: Self-pay

## 2024-11-01 NOTE — Progress Notes (Signed)
 Specialty Pharmacy Refill Coordination Note  Corey Harrison is a 89 y.o. male assessed today regarding refills of clinic administered specialty medication(s) Vutrisiran  Sodium (AMVUTTRA )   Clinic requested Courier to Provider Office   Delivery date: 11/03/24   Verified address: [facility] [address] [place], [place] [address]   Medication will be filled on: 11/02/24   Spoke with Vina Sharps (Daughter)

## 2024-11-02 ENCOUNTER — Other Ambulatory Visit: Payer: Self-pay

## 2024-11-04 ENCOUNTER — Telehealth (HOSPITAL_COMMUNITY): Payer: Self-pay | Admitting: Cardiology

## 2024-11-04 NOTE — Telephone Encounter (Signed)
 Called to confirm/remind patient of their appointment at [facility] on 11/04/2024.   Appointment:   [x] Confirmed  [] Left mess   [] No answer/No voice mail  [] VM Full/unable to leave message  [] Phone not in service  Patient reminded to bring all medications and/or complete list.  Confirmed patient has transportation. Gave directions, instructed to utilize valet parking.

## 2024-11-07 ENCOUNTER — Ambulatory Visit (HOSPITAL_COMMUNITY): Admitting: Cardiology
